# Patient Record
Sex: Female | Born: 1990 | Race: White | Hispanic: No | Smoking: Current some day smoker
Health system: Southern US, Community
[De-identification: ages and names within clinical notes are randomized; demographics above are authoritative.]

## PROBLEM LIST (undated history)

## (undated) ENCOUNTER — Inpatient Hospital Stay (HOSPITAL_COMMUNITY): Payer: Self-pay

## (undated) DIAGNOSIS — F429 Obsessive-compulsive disorder, unspecified: Secondary | ICD-10-CM

## (undated) DIAGNOSIS — G43909 Migraine, unspecified, not intractable, without status migrainosus: Secondary | ICD-10-CM

## (undated) DIAGNOSIS — F329 Major depressive disorder, single episode, unspecified: Secondary | ICD-10-CM

## (undated) DIAGNOSIS — Z8619 Personal history of other infectious and parasitic diseases: Secondary | ICD-10-CM

## (undated) DIAGNOSIS — F909 Attention-deficit hyperactivity disorder, unspecified type: Secondary | ICD-10-CM

## (undated) DIAGNOSIS — F32A Depression, unspecified: Secondary | ICD-10-CM

## (undated) DIAGNOSIS — F410 Panic disorder [episodic paroxysmal anxiety] without agoraphobia: Secondary | ICD-10-CM

## (undated) DIAGNOSIS — F988 Other specified behavioral and emotional disorders with onset usually occurring in childhood and adolescence: Secondary | ICD-10-CM

## (undated) DIAGNOSIS — F419 Anxiety disorder, unspecified: Secondary | ICD-10-CM

## (undated) DIAGNOSIS — R87629 Unspecified abnormal cytological findings in specimens from vagina: Secondary | ICD-10-CM

## (undated) HISTORY — DX: Depression, unspecified: F32.A

## (undated) HISTORY — DX: Anxiety disorder, unspecified: F41.9

## (undated) HISTORY — PX: COLONOSCOPY: SHX174

## (undated) HISTORY — DX: Personal history of other infectious and parasitic diseases: Z86.19

## (undated) HISTORY — DX: Major depressive disorder, single episode, unspecified: F32.9

## (undated) HISTORY — DX: Migraine, unspecified, not intractable, without status migrainosus: G43.909

## (undated) HISTORY — DX: Attention-deficit hyperactivity disorder, unspecified type: F90.9

## (undated) HISTORY — DX: Unspecified abnormal cytological findings in specimens from vagina: R87.629

---
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
## 2010-02-20 ENCOUNTER — Emergency Department (HOSPITAL_COMMUNITY): Admission: EM | Admit: 2010-02-20 | Discharge: 2010-02-20 | Payer: Self-pay | Admitting: Emergency Medicine

## 2010-03-14 ENCOUNTER — Telehealth: Payer: Self-pay | Admitting: Family Medicine

## 2010-03-17 ENCOUNTER — Ambulatory Visit: Payer: Self-pay | Admitting: Family Medicine

## 2010-03-17 DIAGNOSIS — G43909 Migraine, unspecified, not intractable, without status migrainosus: Secondary | ICD-10-CM | POA: Insufficient documentation

## 2010-03-17 DIAGNOSIS — J019 Acute sinusitis, unspecified: Secondary | ICD-10-CM

## 2010-03-22 ENCOUNTER — Encounter: Payer: Self-pay | Admitting: Family Medicine

## 2010-03-22 ENCOUNTER — Ambulatory Visit: Payer: Self-pay | Admitting: Internal Medicine

## 2010-03-22 DIAGNOSIS — S139XXA Sprain of joints and ligaments of unspecified parts of neck, initial encounter: Secondary | ICD-10-CM

## 2010-03-22 DIAGNOSIS — H6980 Other specified disorders of Eustachian tube, unspecified ear: Secondary | ICD-10-CM

## 2010-04-05 ENCOUNTER — Encounter: Admission: RE | Admit: 2010-04-05 | Discharge: 2010-04-05 | Payer: Self-pay | Admitting: Neurology

## 2010-06-06 ENCOUNTER — Ambulatory Visit: Payer: Self-pay | Admitting: Family Medicine

## 2010-06-09 ENCOUNTER — Ambulatory Visit: Payer: Self-pay | Admitting: Family Medicine

## 2010-06-09 DIAGNOSIS — R209 Unspecified disturbances of skin sensation: Secondary | ICD-10-CM

## 2010-06-13 LAB — CONVERTED CEMR LAB
ALT: 13 units/L (ref 0–35)
AST: 22 units/L (ref 0–37)
Albumin: 4.4 g/dL (ref 3.5–5.2)
Alkaline Phosphatase: 66 units/L (ref 39–117)
BUN: 10 mg/dL (ref 6–23)
Basophils Absolute: 0 10*3/uL (ref 0.0–0.1)
Basophils Relative: 0.3 % (ref 0.0–3.0)
Bilirubin, Direct: 0.1 mg/dL (ref 0.0–0.3)
CO2: 27 meq/L (ref 19–32)
Calcium: 9.4 mg/dL (ref 8.4–10.5)
Chloride: 106 meq/L (ref 96–112)
Creatinine, Ser: 0.6 mg/dL (ref 0.4–1.2)
Eosinophils Absolute: 0.1 10*3/uL (ref 0.0–0.7)
Eosinophils Relative: 2 % (ref 0.0–5.0)
GFR calc non Af Amer: 129.71 mL/min (ref >60)
Glucose, Bld: 80 mg/dL (ref 70–99)
HCT: 43.5 % (ref 36.0–46.0)
Hemoglobin: 15 g/dL (ref 12.0–15.0)
Lymphocytes Relative: 24.9 % (ref 12.0–46.0)
Lymphs Abs: 1.3 10*3/uL (ref 0.7–4.0)
MCHC: 34.4 g/dL (ref 30.0–36.0)
MCV: 90.9 fL (ref 78.0–100.0)
Monocytes Absolute: 0.3 10*3/uL (ref 0.1–1.0)
Monocytes Relative: 6.5 % (ref 3.0–12.0)
Neutro Abs: 3.5 10*3/uL (ref 1.4–7.7)
Neutrophils Relative %: 66.3 % (ref 43.0–77.0)
Platelets: 251 10*3/uL (ref 150.0–400.0)
Potassium: 4.2 meq/L (ref 3.5–5.1)
RBC: 4.79 M/uL (ref 3.87–5.11)
RDW: 13.3 % (ref 11.5–14.6)
Sodium: 140 meq/L (ref 135–145)
TSH: 1.62 microintl units/mL (ref 0.35–5.50)
Total Bilirubin: 0.8 mg/dL (ref 0.3–1.2)
Total Protein: 7.8 g/dL (ref 6.0–8.3)
Vitamin B-12: 228 pg/mL (ref 211–911)
WBC: 5.3 10*3/uL (ref 4.5–10.5)

## 2010-07-19 ENCOUNTER — Telehealth: Payer: Self-pay | Admitting: Family Medicine

## 2010-08-09 ENCOUNTER — Telehealth: Payer: Self-pay | Admitting: Family Medicine

## 2010-10-15 HISTORY — PX: BLADDER SURGERY: SHX569

## 2010-10-26 ENCOUNTER — Emergency Department (HOSPITAL_COMMUNITY)
Admission: EM | Admit: 2010-10-26 | Discharge: 2010-10-26 | Payer: Self-pay | Source: Home / Self Care | Admitting: Emergency Medicine

## 2010-10-30 ENCOUNTER — Ambulatory Visit
Admission: RE | Admit: 2010-10-30 | Discharge: 2010-10-30 | Payer: Self-pay | Source: Home / Self Care | Attending: Family Medicine | Admitting: Family Medicine

## 2010-10-30 DIAGNOSIS — N390 Urinary tract infection, site not specified: Secondary | ICD-10-CM | POA: Insufficient documentation

## 2010-10-30 DIAGNOSIS — N760 Acute vaginitis: Secondary | ICD-10-CM | POA: Insufficient documentation

## 2010-10-30 LAB — URINALYSIS, ROUTINE W REFLEX MICROSCOPIC
Bilirubin Urine: NEGATIVE
Ketones, ur: NEGATIVE mg/dL
Nitrite: NEGATIVE
Protein, ur: NEGATIVE mg/dL
Specific Gravity, Urine: 1.022 (ref 1.005–1.030)
Urine Glucose, Fasting: NEGATIVE mg/dL
Urobilinogen, UA: 1 mg/dL (ref 0.0–1.0)
pH: 6.5 (ref 5.0–8.0)

## 2010-10-30 LAB — WET PREP, GENITAL
Trich, Wet Prep: NONE SEEN
Yeast Wet Prep HPF POC: NONE SEEN

## 2010-10-30 LAB — GC/CHLAMYDIA PROBE AMP, GENITAL
Chlamydia, DNA Probe: NEGATIVE
GC Probe Amp, Genital: NEGATIVE

## 2010-10-30 LAB — URINE MICROSCOPIC-ADD ON

## 2010-10-30 LAB — POCT PREGNANCY, URINE: Preg Test, Ur: NEGATIVE

## 2010-11-03 ENCOUNTER — Ambulatory Visit
Admission: RE | Admit: 2010-11-03 | Discharge: 2010-11-03 | Payer: Self-pay | Source: Home / Self Care | Attending: Family Medicine | Admitting: Family Medicine

## 2010-11-03 ENCOUNTER — Telehealth: Payer: Self-pay | Admitting: Family Medicine

## 2010-11-14 NOTE — Progress Notes (Signed)
Summary: Rx Refill  Phone Note Refill Request Call back at Home Phone [phone]   Refills Requested: Medication #1:  ADDERALL 10 MG TABS two times a day Completey out of meds.   Method Requested: Pick up at Office Initial call taken by: Trixie Dredge,  July 19, 2010 11:57 AM Caller: Patient Call placed by: Trixie Dredge,  July 19, 2010 11:57 AM  Follow-up for Phone Call        done Follow-up by: Nelwyn Salisbury MD,  July 19, 2010 2:25 PM  Additional Follow-up for Phone Call Additional follow up Details #1::        Pt informed Rx ready for pick-up Additional Follow-up by: Sid Falcon LPN,  July 19, 2010 4:37 PM    New/Updated Medications: ADDERALL 10 MG TABS (AMPHETAMINE-DEXTROAMPHETAMINE) two times a day ADDERALL 10 MG TABS (AMPHETAMINE-DEXTROAMPHETAMINE) two times a day, may fill on 08-19-10 ADDERALL 10 MG TABS (AMPHETAMINE-DEXTROAMPHETAMINE) two times a day, may fill on 09-18-10 Prescriptions: ADDERALL 10 MG TABS (AMPHETAMINE-DEXTROAMPHETAMINE) two times a day, may fill on 09-18-10  #60 x 0   Entered and Authorized by:   Nelwyn Salisbury MD   Signed by:   Nelwyn Salisbury MD on 07/19/2010   Method used:   Print then Give to Patient   RxID:   [id number] ADDERALL 10 MG TABS (AMPHETAMINE-DEXTROAMPHETAMINE) two times a day, may fill on 08-19-10  #60 x 0   Entered and Authorized by:   Nelwyn Salisbury MD   Signed by:   Nelwyn Salisbury MD on 07/19/2010   Method used:   Print then Give to Patient   RxID:   [id number] ADDERALL 10 MG TABS (AMPHETAMINE-DEXTROAMPHETAMINE) two times a day  #60 x 0   Entered and Authorized by:   Nelwyn Salisbury MD   Signed by:   Nelwyn Salisbury MD on 07/19/2010   Method used:   Print then Give to Patient   RxID:   [id number]

## 2010-11-14 NOTE — Assessment & Plan Note (Signed)
Summary: ST / SINUSITIS? // RS   Vital Signs:  Patient profile:   20 year old female Weight:      132 pounds BMI:     20.75 Temp:     98.3 degrees F oral BP sitting:   84 / 60  (left arm) Cuff size:   regular  Vitals Entered By: Raechel Ache, RN (March 17, 2010 11:41 AM) CC: C/o sore throat, sinus cong, ears full, headache.   History of Present Illness: Here for 5 days of sinus pressure, HA, ST, and a dry cough. No fever. Also she asks me to write her a rx for Xanax. She had tried Ativan for anxiety, but it did not work well and it made her feel nauseated. She tried some of her friend's Xanax, and this worked quite well with no side effects. She was in the ER a few weeks ago for a migraine HA. She is using Excedrin Migraine now with success, but she is scheduled to meet with a neurologist for the first time in a few weeks. She does not remember his name.   Allergies (verified): No Known Drug Allergies  Past History:  Past Medical History: Anxiety Depression ADHD asthma migraines  Review of Systems  The patient denies anorexia, fever, weight loss, weight gain, vision loss, decreased hearing, hoarseness, chest pain, syncope, dyspnea on exertion, peripheral edema, hemoptysis, abdominal pain, melena, hematochezia, severe indigestion/heartburn, hematuria, incontinence, genital sores, muscle weakness, suspicious skin lesions, transient blindness, difficulty walking, depression, unusual weight change, abnormal bleeding, enlarged lymph nodes, angioedema, breast masses, and testicular masses.    Physical Exam  General:  Well-developed,well-nourished,in no acute distress; alert,appropriate and cooperative throughout examination Head:  Normocephalic and atraumatic without obvious abnormalities. No apparent alopecia or balding. Eyes:  No corneal or conjunctival inflammation noted. EOMI. Perrla. Funduscopic exam benign, without hemorrhages, exudates or papilledema. Vision grossly  normal. Ears:  External ear exam shows no significant lesions or deformities.  Otoscopic examination reveals clear canals, tympanic membranes are intact bilaterally without bulging, retraction, inflammation or discharge. Hearing is grossly normal bilaterally. Nose:  External nasal examination shows no deformity or inflammation. Nasal mucosa are pink and moist without lesions or exudates. Mouth:  Oral mucosa and oropharynx without lesions or exudates.  Teeth in good repair. Neck:  No deformities, masses, or tenderness noted. Lungs:  Normal respiratory effort, chest expands symmetrically. Lungs are clear to auscultation, no crackles or wheezes. Neurologic:  alert & oriented X3, cranial nerves II-XII intact, and gait normal.   Psych:  Cognition and judgment appear intact. Alert and cooperative with normal attention span and concentration. No apparent delusions, illusions, hallucinations   Impression & Recommendations:  Problem # 1:  ACUTE SINUSITIS, UNSPECIFIED (ICD-461.9)  Her updated medication list for this problem includes:    Zithromax Z-pak 250 Mg Tabs (Azithromycin) .Marland Kitchen... As directed  Problem # 2:  MIGRAINE HEADACHE (ICD-346.90)  Problem # 3:  ASTHMA (ICD-493.90)  Her updated medication list for this problem includes:    Proventil Hfa 108 (90 Base) Mcg/act Aers (Albuterol sulfate) .Marland Kitchen... 1-2 puffs every 4hrs as needed for shortness of breath.  Complete Medication List: 1)  Proventil Hfa 108 (90 Base) Mcg/act Aers (Albuterol sulfate) .Marland Kitchen.. 1-2 puffs every 4hrs as needed for shortness of breath. 2)  Adderall 10 Mg Tabs (Amphetamine-dextroamphetamine) .... Two times a day, may fill on 05-13-10 3)  Alprazolam 0.25 Mg Tabs (Alprazolam) .... Two times a day as needed anxiety 4)  Zithromax Z-pak 250 Mg Tabs (Azithromycin) .Marland KitchenMarland KitchenMarland Kitchen  As directed  Patient Instructions: 1)  Out of work today. Try Xanax as above.  2)  Please schedule a follow-up appointment as needed .  Prescriptions: ZITHROMAX  Z-PAK 250 MG TABS (AZITHROMYCIN) as directed  #1 x 0   Entered and Authorized by:   Nelwyn Salisbury MD   Signed by:   Nelwyn Salisbury MD on 03/17/2010   Method used:   Print then Give to Patient   RxID:   [id number] ALPRAZOLAM 0.25 MG TABS (ALPRAZOLAM) two times a day as needed anxiety  #60 x 2   Entered and Authorized by:   Nelwyn Salisbury MD   Signed by:   Nelwyn Salisbury MD on 03/17/2010   Method used:   Print then Give to Patient   RxID:   [id number]

## 2010-11-14 NOTE — Progress Notes (Signed)
Summary: Pt req Adderall 10mg  tabs  Phone Note Call from Patient Call back at [phone]   Caller: Patient Summary of Call: Pt req Adderall 10mg  tabs.  Initial call taken by: Lucy Antigua,  December 12, 2009 11:50 AM  Follow-up for Phone Call        done Follow-up by: Nelwyn Salisbury MD,  December 12, 2009 4:45 PM  Additional Follow-up for Phone Call Additional follow up Details #1::        rx up front ready for p/u, pt aware Additional Follow-up by: Alfred Levins, CMA,  December 12, 2009 4:54 PM    New/Updated Medications: ADDERALL 10 MG TABS (AMPHETAMINE-DEXTROAMPHETAMINE) two times a day ADDERALL 10 MG TABS (AMPHETAMINE-DEXTROAMPHETAMINE) two times a day, may fill on 01-09-10 ADDERALL 10 MG TABS (AMPHETAMINE-DEXTROAMPHETAMINE) two times a day, may fill on 02-09-10 Prescriptions: ADDERALL 10 MG TABS (AMPHETAMINE-DEXTROAMPHETAMINE) two times a day, may fill on 02-09-10  #60 x 0   Entered and Authorized by:   Nelwyn Salisbury MD   Signed by:   Nelwyn Salisbury MD on 12/12/2009   Method used:   Print then Give to Patient   RxID:   [id number] ADDERALL 10 MG TABS (AMPHETAMINE-DEXTROAMPHETAMINE) two times a day, may fill on 01-09-10  #60 x 0   Entered and Authorized by:   Nelwyn Salisbury MD   Signed by:   Nelwyn Salisbury MD on 12/12/2009   Method used:   Print then Give to Patient   RxID:   [id number] ADDERALL 10 MG TABS (AMPHETAMINE-DEXTROAMPHETAMINE) two times a day  #60 x 0   Entered and Authorized by:   Nelwyn Salisbury MD   Signed by:   Nelwyn Salisbury MD on 12/12/2009   Method used:   Print then Give to Patient   RxID:   [id number]

## 2010-11-14 NOTE — Progress Notes (Signed)
Summary: refill  Phone Note Refill Request Call back at [phone] Message from:  Patient---live call  Refills Requested: Medication #1:  ADDERALL 10 MG TABS two times a day call pt when ready.  Initial call taken by: Warnell Forester,  Mar 14, 2010 2:35 PM  Follow-up for Phone Call        done Follow-up by: Nelwyn Salisbury MD,  Mar 14, 2010 4:29 PM  Additional Follow-up for Phone Call Additional follow up Details #1::        called Additional Follow-up by: Raechel Ache, RN,  Mar 14, 2010 4:32 PM    New/Updated Medications: ADDERALL 10 MG TABS (AMPHETAMINE-DEXTROAMPHETAMINE) two times a day ADDERALL 10 MG TABS (AMPHETAMINE-DEXTROAMPHETAMINE) two times a day, may fill on 04-13-10 ADDERALL 10 MG TABS (AMPHETAMINE-DEXTROAMPHETAMINE) two times a day, may fill on 05-13-10 Prescriptions: ADDERALL 10 MG TABS (AMPHETAMINE-DEXTROAMPHETAMINE) two times a day, may fill on 05-13-10  #60 x 0   Entered and Authorized by:   Nelwyn Salisbury MD   Signed by:   Nelwyn Salisbury MD on 03/14/2010   Method used:   Print then Give to Patient   RxID:   [id number] ADDERALL 10 MG TABS (AMPHETAMINE-DEXTROAMPHETAMINE) two times a day, may fill on 04-13-10  #60 x 0   Entered and Authorized by:   Nelwyn Salisbury MD   Signed by:   Nelwyn Salisbury MD on 03/14/2010   Method used:   Print then Give to Patient   RxID:   [id number] ADDERALL 10 MG TABS (AMPHETAMINE-DEXTROAMPHETAMINE) two times a day  #60 x 0   Entered and Authorized by:   Nelwyn Salisbury MD   Signed by:   Nelwyn Salisbury MD on 03/14/2010   Method used:   Print then Give to Patient   RxID:   [id number]
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
## 2010-11-14 NOTE — Letter (Signed)
Summary: [facility]  [facility]   Imported By: Maryln Gottron 04/03/2010 12:14:51  _____________________________________________________________________  External Attachment:    Type:   Image     Comment:   External Document

## 2010-11-14 NOTE — Assessment & Plan Note (Signed)
Summary: med check/refill/cjr/pt rsc/cjr ok per cindy/njr   Vital Signs:  Patient profile:   21 year old female Weight:      138 pounds Temp:     98.4 degrees F oral Pulse rate:   100 / minute BP sitting:   112 / 64  (left arm) Cuff size:   regular  Vitals Entered By: Alfred Levins, CMA (October 19, 2009 4:45 PM) CC: discuss meds   History of Present Illness: Here to follow up on meds for ADHD, anxiety, and depression. She is very pleased with Adderall and wants to stay on this. The amitriptyline however is causing a lot of side effects including dayitme somnolence and vivid upsetting dreams at night. Her anxiety is no better either.   Current Medications (verified): 1)  Proventil Hfa 108 (90 Base) Mcg/act Aers (Albuterol Sulfate) .Marland Kitchen.. 1-2 Puffs Every 4hrs As Needed For Shortness of Breath. 2)  Adderall 10 Mg Tabs (Amphetamine-Dextroamphetamine) .... Two Times A Day, May Fill On 11-07-09 3)  Amitriptyline Hcl 25 Mg Tabs (Amitriptyline Hcl) .... At Bedtime  Allergies (verified): No Known Drug Allergies  Past History:  Past Medical History: Reviewed history from 08/10/2009 and no changes required. Anxiety Depression ADHD asthma  Review of Systems  The patient denies anorexia, fever, weight loss, weight gain, vision loss, decreased hearing, hoarseness, chest pain, syncope, dyspnea on exertion, peripheral edema, prolonged cough, headaches, hemoptysis, abdominal pain, melena, hematochezia, severe indigestion/heartburn, hematuria, incontinence, genital sores, muscle weakness, suspicious skin lesions, transient blindness, difficulty walking, unusual weight change, abnormal bleeding, enlarged lymph nodes, angioedema, breast masses, and testicular masses.    Physical Exam  General:  Well-developed,well-nourished,in no acute distress; alert,appropriate and cooperative throughout examination Neurologic:  alert & oriented X3, cranial nerves II-XII intact, and gait normal.   Psych:   Cognition and judgment appear intact. Alert and cooperative with normal attention span and concentration. No apparent delusions, illusions, hallucinations   Impression & Recommendations:  Problem # 1:  DEPRESSION (ICD-311)  The following medications were removed from the medication list:    Amitriptyline Hcl 25 Mg Tabs (Amitriptyline hcl) .Marland Kitchen... At bedtime Her updated medication list for this problem includes:    Ativan 1 Mg Tabs (Lorazepam) .Marland Kitchen... 1 or 2 tabs two times a day as needed anxiety  Problem # 2:  ANXIETY (ICD-300.00)  The following medications were removed from the medication list:    Amitriptyline Hcl 25 Mg Tabs (Amitriptyline hcl) .Marland Kitchen... At bedtime Her updated medication list for this problem includes:    Ativan 1 Mg Tabs (Lorazepam) .Marland Kitchen... 1 or 2 tabs two times a day as needed anxiety  Problem # 3:  ADD (ICD-314.00)  Complete Medication List: 1)  Proventil Hfa 108 (90 Base) Mcg/act Aers (Albuterol sulfate) .Marland Kitchen.. 1-2 puffs every 4hrs as needed for shortness of breath. 2)  Adderall 10 Mg Tabs (Amphetamine-dextroamphetamine) .... Two times a day, may fill on 11-07-09 3)  Ativan 1 Mg Tabs (Lorazepam) .Marland Kitchen.. 1 or 2 tabs two times a day as needed anxiety  Patient Instructions: 1)  refilled Adderall. Stop Amitriptyline. try Ativan on an as needed basis.  2)  Please schedule a follow-up appointment in 1 month.  Prescriptions: ATIVAN 1 MG TABS (LORAZEPAM) 1 or 2 tabs two times a day as needed anxiety  #60 x 2   Entered and Authorized by:   Nelwyn Salisbury MD   Signed by:   Nelwyn Salisbury MD on 10/19/2009   Method used:   Print then Give to  Patient   RxID:   [id number]

## 2010-11-14 NOTE — Assessment & Plan Note (Signed)
Summary: face/ eye twitching//ccm   Vital Signs:  Patient profile:   20 year old female Weight:      131 pounds BMI:     20.59 Temp:     98.6 degrees F oral BP sitting:   90 / 60  (left arm) Cuff size:   regular  Vitals Entered By: Raechel Ache, RN (June 09, 2010 9:38 AM) CC: C/o body aches, fatigue, fearful & anxious, had face tingling yesterday.   History of Present Illness: Here for 2 things. First she has had increasing problems with anxiety for the past 6 months. She is always tense, worries about things, and she says she has a lot of obsessive-compulsive tendencies. She sleeps well, and she has not been particularly depressed lately. Shehas used SSRIs in the past like Zoloft and Prozac with mixed results. She would like to try Lexapro now. Second, yesterday am at work she had the sudden onset of tingling all over her face and in both hands.No vision changes, no HA, no other symptoms. This lasted all day unti.l she went to bed. This am the tingling is gone and she feels okay. Of note she just started on Topamax 5 days ago.   Allergies: No Known Drug Allergies  Past History:  Past Medical History: Reviewed history from 06/06/2010 and no changes required. Anxiety Depression ADHD asthma migraines, sees Dr. Jake Samples  Review of Systems  The patient denies anorexia, fever, weight loss, weight gain, vision loss, decreased hearing, hoarseness, chest pain, syncope, dyspnea on exertion, peripheral edema, prolonged cough, headaches, hemoptysis, abdominal pain, melena, hematochezia, severe indigestion/heartburn, hematuria, incontinence, genital sores, muscle weakness, suspicious skin lesions, transient blindness, difficulty walking, depression, unusual weight change, abnormal bleeding, enlarged lymph nodes, angioedema, breast masses, and testicular masses.    Physical Exam  General:  Well-developed,well-nourished,in no acute distress; alert,appropriate and cooperative  throughout examination Head:  Normocephalic and atraumatic without obvious abnormalities. No apparent alopecia or balding. Eyes:  No corneal or conjunctival inflammation noted. EOMI. Perrla. Funduscopic exam benign, without hemorrhages, exudates or papilledema. Vision grossly normal. Ears:  External ear exam shows no significant lesions or deformities.  Otoscopic examination reveals clear canals, tympanic membranes are intact bilaterally without bulging, retraction, inflammation or discharge. Hearing is grossly normal bilaterally. Nose:  External nasal examination shows no deformity or inflammation. Nasal mucosa are pink and moist without lesions or exudates. Mouth:  Oral mucosa and oropharynx without lesions or exudates.  Teeth in good repair. Neck:  No deformities, masses, or tenderness noted. Lungs:  Normal respiratory effort, chest expands symmetrically. Lungs are clear to auscultation, no crackles or wheezes. Heart:  Normal rate and regular rhythm. S1 and S2 normal without gallop, murmur, click, rub or other extra sounds. Neurologic:  No cranial nerve deficits noted. Station and gait are normal. Plantar reflexes are down-going bilaterally. DTRs are symmetrical throughout. Sensory, motor and coordinative functions appear intact. Psych:  Oriented X3, memory intact for recent and remote, normally interactive, good eye contact, and moderately anxious.     Impression & Recommendations:  Problem # 1:  PARESTHESIA (ICD-782.0)  Orders: Venipuncture (16109) TLB-BMP (Basic Metabolic Panel-BMET) (80048-METABOL) TLB-CBC Platelet - w/Differential (85025-CBCD) TLB-Hepatic/Liver Function Pnl (80076-HEPATIC) TLB-TSH (Thyroid Stimulating Hormone) (84443-TSH) TLB-B12, Serum-Total ONLY (60454-U98)  Problem # 2:  MIGRAINE HEADACHE (ICD-346.90)  Her updated medication list for this problem includes:    Ibuprofen 800 Mg Tabs (Ibuprofen) .Marland Kitchen... 1 by mouth q8 hours  for pain    Sumatriptan Succinate 100 Mg  Tabs (Sumatriptan succinate) .Marland KitchenMarland KitchenMarland KitchenMarland Kitchen  As needed  Problem # 3:  ANXIETY (ICD-300.00)  Her updated medication list for this problem includes:    Alprazolam 0.25 Mg Tabs (Alprazolam) .Marland Kitchen..Marland Kitchen Two times a day as needed anxiety    Lexapro 10 Mg Tabs (Escitalopram oxalate) ..... Once daily  Complete Medication List: 1)  Proventil Hfa 108 (90 Base) Mcg/act Aers (Albuterol sulfate) .Marland Kitchen.. 1-2 puffs every 4hrs as needed for shortness of breath. 2)  Adderall 10 Mg Tabs (Amphetamine-dextroamphetamine) .... Two times a day, may fill on 05-13-10 3)  Alprazolam 0.25 Mg Tabs (Alprazolam) .... Two times a day as needed anxiety 4)  Ibuprofen 800 Mg Tabs (Ibuprofen) .Marland Kitchen.. 1 by mouth q8 hours  for pain 5)  Topamax 25 Mg Tabs (Topiramate) .... Once daily 6)  Sumatriptan Succinate 100 Mg Tabs (Sumatriptan succinate) .... As needed 7)  Mirena 20 Mcg/24hr Iud (Levonorgestrel) 8)  Lexapro 10 Mg Tabs (Escitalopram oxalate) .... Once daily  Patient Instructions: 1)  I think the tingling was a side efect of Topamax, so I told her to stop taaking this. Will get labs today. try Lexapro daily. Given today off work.  Prescriptions: LEXAPRO 10 MG TABS (ESCITALOPRAM OXALATE) once daily  #30 x 2   Entered and Authorized by:   Nelwyn Salisbury MD   Signed by:   Nelwyn Salisbury MD on 06/09/2010   Method used:   Electronically to        [facility]  [phone]* (retail)       [address]       [place], [place]  [address]       Ph: [phone] or [phone]       Fax: [phone]   RxID:   [id number]
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
## 2010-11-14 NOTE — Progress Notes (Signed)
Summary: requesting alternate rx  Phone Note Call from Patient Call back at Home Phone [phone]   Caller: Patient---live call Summary of Call: On Adderall 10mg  on manuf. back order. please advise of alternate. pt is out of meds. Initial call taken by: Warnell Forester,  August 09, 2010 1:27 PM  Follow-up for Phone Call        Pt called back and said she took original script to [facility] and was told that they only had 30, but since then, the pt has found out that [facility] pharmacy has the Adderall 10mg  two times a day. Pt req to get script asap today, because pharmacy will run out.  Follow-up by: Lucy Antigua,  August 09, 2010 2:43 PM  Additional Follow-up for Phone Call Additional follow up Details #1::        change to Adderall XR Additional Follow-up by: Nelwyn Salisbury MD,  August 09, 2010 4:32 PM    Additional Follow-up for Phone Call Additional follow up Details #2::    pt aware.  Follow-up by: Pura Spice, RN,  August 09, 2010 4:58 PM  New/Updated Medications: ADDERALL XR 10 MG XR24H-CAP (AMPHETAMINE-DEXTROAMPHETAMINE) once daily Prescriptions: ADDERALL XR 10 MG XR24H-CAP (AMPHETAMINE-DEXTROAMPHETAMINE) once daily  #30 x 0   Entered and Authorized by:   Nelwyn Salisbury MD   Signed by:   Nelwyn Salisbury MD on 08/09/2010   Method used:   Print then Give to Patient   RxID:   [id number]

## 2010-11-16 NOTE — Assessment & Plan Note (Signed)
Summary: lump on neck/ccm   Vital Signs:  Patient profile:   20 year old female Weight:      138 pounds Temp:     98.5 degrees F oral BP sitting:   96 / 62  (left arm) Cuff size:   regular  Vitals Entered By: Alfred Levins, CMA (October 30, 2010 2:15 PM) CC: painful lump on neck   History of Present Illness: Here for a complex picture. She started to have pelvic pain last week, and saw her GYN. She was diagnosed with a UTI and started on Macrobid. Then the pelvic pain got worse so she went to the ER over the weekend. They diagnosed a bacterial vaginosis and started her on Flagyl. This is better now, but then 2 days ago she developed sinus pressure, left ear pain, ST, and a painful swelling on the left side of her neck. No fever or cough.   Current Medications (verified): 1)  Adderall 10 Mg Tabs (Amphetamine-Dextroamphetamine) .... Two Times A Day, May Fill On 09-18-10 2)  Alprazolam 0.25 Mg Tabs (Alprazolam) .... Two Times A Day As Needed Anxiety 3)  Ibuprofen 800 Mg Tabs (Ibuprofen) .Marland Kitchen.. 1 By Mouth Q8 Hours  For Pain 4)  Imitrex 100 Mg Tabs (Sumatriptan Succinate) .Marland Kitchen.. 1 By Mouth At The Onset of Migraine. May Repeat Dose in One Hour If Headache Not Resolved 5)  Sumatriptan Succinate 100 Mg Tabs (Sumatriptan Succinate) .... As Needed 6)  Mirena 20 Mcg/24hr Iud (Levonorgestrel) 7)  Adderall Xr 10 Mg Xr24h-Cap (Amphetamine-Dextroamphetamine) .... Once Daily  Allergies (verified): No Known Drug Allergies  Past History:  Past Medical History: Reviewed history from 06/06/2010 and no changes required. Anxiety Depression ADHD asthma migraines, sees Dr. Jake Samples  Past Surgical History: Reviewed history from 07/09/2007 and no changes required. Denies surgical history  Review of Systems  The patient denies anorexia, fever, weight loss, weight gain, vision loss, decreased hearing, hoarseness, chest pain, syncope, dyspnea on exertion, peripheral edema, prolonged cough,  hemoptysis, melena, hematochezia, severe indigestion/heartburn, hematuria, incontinence, genital sores, muscle weakness, suspicious skin lesions, transient blindness, difficulty walking, depression, unusual weight change, abnormal bleeding, enlarged lymph nodes, angioedema, breast masses, and testicular masses.    Physical Exam  General:  Well-developed,well-nourished,in no acute distress; alert,appropriate and cooperative throughout examination Head:  Normocephalic and atraumatic without obvious abnormalities. No apparent alopecia or balding. Eyes:  No corneal or conjunctival inflammation noted. EOMI. Perrla. Funduscopic exam benign, without hemorrhages, exudates or papilledema. Vision grossly normal. Ears:  External ear exam shows no significant lesions or deformities.  Otoscopic examination reveals clear canals, tympanic membranes are intact bilaterally without bulging, retraction, inflammation or discharge. Hearing is grossly normal bilaterally. Nose:  External nasal examination shows no deformity or inflammation. Nasal mucosa are pink and moist without lesions or exudates. Mouth:  Oral mucosa and oropharynx without lesions or exudates.  Teeth in good repair. Neck:  No deformities, masses, or tenderness noted. Lungs:  Normal respiratory effort, chest expands symmetrically. Lungs are clear to auscultation, no crackles or wheezes. Abdomen:  Bowel sounds positive,abdomen soft and non-tender without masses, organomegaly or hernias noted.   Impression & Recommendations:  Problem # 1:  ACUTE SINUSITIS, UNSPECIFIED (ICD-461.9)  Her updated medication list for this problem includes:    Sulfamethoxazole-trimethoprim 400-80 Mg/67ml Soln (Sulfamethoxazole-trimethoprim) .Marland Kitchen... 2 tsp two times a day  Problem # 2:  UTI (ICD-599.0)  Her updated medication list for this problem includes:    Sulfamethoxazole-trimethoprim 400-80 Mg/44ml Soln (Sulfamethoxazole-trimethoprim) .Marland Kitchen... 2 tsp two times a  day  Problem # 3:  BACTERIAL VAGINITIS (ICD-616.10)  Her updated medication list for this problem includes:    Sulfamethoxazole-trimethoprim 400-80 Mg/14ml Soln (Sulfamethoxazole-trimethoprim) .Marland Kitchen... 2 tsp two times a day  Complete Medication List: 1)  Adderall 10 Mg Tabs (Amphetamine-dextroamphetamine) .... Two times a day, may fill on 09-18-10 2)  Alprazolam 0.25 Mg Tabs (Alprazolam) .... Two times a day as needed anxiety 3)  Ibuprofen 800 Mg Tabs (Ibuprofen) .Marland Kitchen.. 1 by mouth q8 hours  for pain 4)  Imitrex 100 Mg Tabs (Sumatriptan succinate) .Marland Kitchen.. 1 by mouth at the onset of migraine. may repeat dose in one hour if headache not resolved 5)  Sumatriptan Succinate 100 Mg Tabs (Sumatriptan succinate) .... As needed 6)  Mirena 20 Mcg/24hr Iud (Levonorgestrel) 7)  Adderall Xr 10 Mg Xr24h-cap (Amphetamine-dextroamphetamine) .... Once daily 8)  Sulfamethoxazole-trimethoprim 400-80 Mg/85ml Soln (Sulfamethoxazole-trimethoprim) .... 2 tsp two times a day  Patient Instructions: 1)  Please schedule a follow-up appointment as needed .  Prescriptions: ADDERALL XR 10 MG XR24H-CAP (AMPHETAMINE-DEXTROAMPHETAMINE) once daily  #30 x 0   Entered and Authorized by:   Nelwyn Salisbury MD   Signed by:   Nelwyn Salisbury MD on 10/30/2010   Method used:   Print then Give to Patient   RxID:   [id number] SULFAMETHOXAZOLE-TRIMETHOPRIM 400-80 MG/5ML SOLN (SULFAMETHOXAZOLE-TRIMETHOPRIM) 2 tsp two times a day  #10 x 0   Entered and Authorized by:   Nelwyn Salisbury MD   Signed by:   Nelwyn Salisbury MD on 10/30/2010   Method used:   Print then Give to Patient   RxID:   [id number]    Orders Added: 1)  Est. Patient Level IV [44034]

## 2010-11-16 NOTE — Progress Notes (Signed)
Summary: REQUEST FOR DIFFERENT ABX?  Phone Note Call from Patient   Caller: Patient  [phone] Summary of Call: Pt wants to have new Rx sent to [facility].... Adv that the med SULFAMETHOXAZOLE-TRIMETHOPRIM 400-80 MG/5ML (abx) that she was given is causing diarrhea.Marland KitchenMarland KitchenMarland KitchenDeclined OV, can't miss work - adv she just wants Rx changed.... Pt can be reached at [phone].  Initial call taken by: Debbra Riding,  November 03, 2010 8:21 AM  Follow-up for Phone Call        mom call concerning daughter getting ov, pt decline ov for today Follow-up by: Heron Sabins,  November 03, 2010 8:40 AM  Additional Follow-up for Phone Call Additional follow up Details #1::        Pt called back and adv that she was at work but she has decided to see if she can get off to come in for OV.... Pt scheduled for OV at 2:30pm today with Dr Clent Ridges.  Additional Follow-up by: Debbra Riding,  November 03, 2010 8:45 AM

## 2010-11-16 NOTE — Assessment & Plan Note (Signed)
Summary: DIARRHEA - SXS OF ABX? // RS   Vital Signs:  Patient profile:   20 year old female O2 Sat:      97 % Temp:     98.2 degrees F Pulse rate:   106 / minute BP sitting:   90 / 60  (left arm) Cuff size:   regular  Vitals Entered By: Pura Spice, RN (November 03, 2010 2:42 PM) CC: diarrhea since sunday took laxative on Sat states was on 2 antibiotics  completed flagyl now on sulfa   History of Present Illness: Here for several days of diarrhea after being on multiple antibiotics in the past few weeks. She has finished the Flagy, and now her pelvic pain has totally resolved. She has a few more days of BACTRIM left, and now her urinary symptoms are gone as well. She has had several days of diarrhea, and this is why she came today. No N or V, no fever.   Allergies: No Known Drug Allergies  Past History:  Past Medical History: Reviewed history from 06/06/2010 and no changes required. Anxiety Depression ADHD asthma migraines, sees Dr. Jake Samples  Review of Systems  The patient denies anorexia, fever, weight loss, weight gain, vision loss, decreased hearing, hoarseness, chest pain, syncope, dyspnea on exertion, peripheral edema, prolonged cough, headaches, hemoptysis, abdominal pain, melena, hematochezia, severe indigestion/heartburn, hematuria, incontinence, genital sores, muscle weakness, suspicious skin lesions, transient blindness, difficulty walking, depression, unusual weight change, abnormal bleeding, enlarged lymph nodes, angioedema, breast masses, and testicular masses.    Physical Exam  General:  Well-developed,well-nourished,in no acute distress; alert,appropriate and cooperative throughout examination Abdomen:  Bowel sounds positive,abdomen soft and non-tender without masses, organomegaly or hernias noted.   Impression & Recommendations:  Problem # 1:  UTI (ICD-599.0)  Her updated medication list for this problem includes:    Sulfamethoxazole-trimethoprim  400-80 Mg/44ml Soln (Sulfamethoxazole-trimethoprim) .Marland Kitchen... 2 tsp two times a day  Problem # 2:  BACTERIAL VAGINITIS (ICD-616.10)  Her updated medication list for this problem includes:    Sulfamethoxazole-trimethoprim 400-80 Mg/21ml Soln (Sulfamethoxazole-trimethoprim) .Marland Kitchen... 2 tsp two times a day  Complete Medication List: 1)  Adderall 10 Mg Tabs (Amphetamine-dextroamphetamine) .... Two times a day, may fill on 09-18-10 2)  Alprazolam 0.25 Mg Tabs (Alprazolam) .... Two times a day as needed anxiety 3)  Ibuprofen 800 Mg Tabs (Ibuprofen) .Marland Kitchen.. 1 by mouth q8 hours  for pain 4)  Imitrex 100 Mg Tabs (Sumatriptan succinate) .Marland Kitchen.. 1 by mouth at the onset of migraine. may repeat dose in one hour if headache not resolved 5)  Sumatriptan Succinate 100 Mg Tabs (Sumatriptan succinate) .... As needed 6)  Mirena 20 Mcg/24hr Iud (Levonorgestrel) 7)  Sulfamethoxazole-trimethoprim 400-80 Mg/35ml Soln (Sulfamethoxazole-trimethoprim) .... 2 tsp two times a day 8)  Adderall 10 Mg Tabs (Amphetamine-dextroamphetamine) .... Two times a day 9)  Lomotil 2.5-0.025 Mg Tabs (Diphenoxylate-atropine) .Marland Kitchen.. 1 tab q 6 hours as needed diarrhea  Patient Instructions: 1)  Finsh up the Bactrim. Drink fluids. try Lomotil as needed .  Prescriptions: LOMOTIL 2.5-0.025 MG TABS (DIPHENOXYLATE-ATROPINE) 1 tab q 6 hours as needed diarrhea  #60 x 0   Entered and Authorized by:   Nelwyn Salisbury MD   Signed by:   Nelwyn Salisbury MD on 11/03/2010   Method used:   Print then Give to Patient   RxID:   [id number]    Orders Added: 1)  Est. Patient Level IV [32951]

## 2010-12-15 ENCOUNTER — Telehealth: Payer: Self-pay | Admitting: Family Medicine

## 2010-12-15 MED ORDER — AMPHETAMINE-DEXTROAMPHETAMINE 10 MG PO TABS: 10.0000 mg | ORAL_TABLET | Freq: Two times a day (BID) | ORAL | Status: DC

## 2010-12-15 NOTE — Telephone Encounter (Signed)
adderall rx's are in your box. Call in Xanax 0.25 mg bid , #60 with 5 rf

## 2010-12-15 NOTE — Telephone Encounter (Signed)
PT NEEDS A REFILL MEDS: ADDERALL 10MG .... PT CAN BE REACHED AT [phone].Marland KitchenMarland KitchenMarland KitchenAlso needs refill on xanax .25 sent to [facility].

## 2010-12-15 NOTE — Telephone Encounter (Signed)
rx up front ready for p/u, pt aware 

## 2011-01-02 LAB — URINE MICROSCOPIC-ADD ON

## 2011-01-02 LAB — URINALYSIS, ROUTINE W REFLEX MICROSCOPIC
Bilirubin Urine: NEGATIVE
Glucose, UA: NEGATIVE mg/dL
Hgb urine dipstick: NEGATIVE
Ketones, ur: NEGATIVE mg/dL
Nitrite: NEGATIVE
Protein, ur: NEGATIVE mg/dL
Specific Gravity, Urine: 1.018 (ref 1.005–1.030)
Urobilinogen, UA: 0.2 mg/dL (ref 0.0–1.0)
pH: 7.5 (ref 5.0–8.0)

## 2011-01-02 LAB — PREGNANCY, URINE: Preg Test, Ur: NEGATIVE

## 2011-02-19 ENCOUNTER — Other Ambulatory Visit: Payer: Self-pay | Admitting: Family Medicine

## 2011-02-20 ENCOUNTER — Other Ambulatory Visit: Payer: Self-pay | Admitting: *Deleted

## 2011-02-20 NOTE — Telephone Encounter (Signed)
Pt is switching pharmacies and would like a refill on xanax 0.25mg 

## 2011-02-21 MED ORDER — ALPRAZOLAM 0.25 MG PO TABS: 0.2500 mg | ORAL_TABLET | Freq: Two times a day (BID) | ORAL | Status: DC

## 2011-02-21 NOTE — Telephone Encounter (Signed)
She takes this bid. Call in #60 with 5 rf

## 2011-02-21 NOTE — Telephone Encounter (Signed)
Per Pt, [facility] is new pharmacy. Rx phoned in. Done.

## 2011-03-02 NOTE — Assessment & Plan Note (Signed)
[facility]                                   ON-CALL NOTE   Brooke Castaneda, Brooke Castaneda                       MRN:          [id number]  DATE:06/21/2006                            DOB:          1991/10/12    CALLER:  Brett Canales at [facility].   TELEPHONE:  [phone].   She sees Dr. Abran Cantor.   I saw the patient today in my clinic for head lice.  I prescribed 5%  Permethrin shampoo.  Pharmacy reports that they only have 5% Permethrin  cream on hand.  My response is to change the prescription to the cream and  to use the directions given before.                                   Tera Mater. Clent Ridges, MD   SAF/MedQ  DD:  06/21/2006  DT:  06/22/2006  Job #:  [id number]

## 2011-04-17 ENCOUNTER — Ambulatory Visit (HOSPITAL_BASED_OUTPATIENT_CLINIC_OR_DEPARTMENT_OTHER)
Admission: RE | Admit: 2011-04-17 | Discharge: 2011-04-17 | Disposition: A | Discharge status: Home / Self Care | Payer: 59 | Source: Ambulatory Visit | Attending: Urology | Admitting: Urology

## 2011-04-17 DIAGNOSIS — Z01812 Encounter for preprocedural laboratory examination: Secondary | ICD-10-CM | POA: Insufficient documentation

## 2011-04-17 DIAGNOSIS — N949 Unspecified condition associated with female genital organs and menstrual cycle: Secondary | ICD-10-CM | POA: Insufficient documentation

## 2011-04-17 DIAGNOSIS — R350 Frequency of micturition: Secondary | ICD-10-CM | POA: Insufficient documentation

## 2011-04-17 DIAGNOSIS — N3010 Interstitial cystitis (chronic) without hematuria: Secondary | ICD-10-CM | POA: Insufficient documentation

## 2011-04-17 LAB — POCT HEMOGLOBIN-HEMACUE: Hemoglobin: 14.9 g/dL (ref 12.0–15.0)

## 2011-04-17 LAB — POCT PREGNANCY, URINE: Preg Test, Ur: NEGATIVE

## 2011-05-01 NOTE — Op Note (Signed)
  Brooke Castaneda, TIMMINS NO.:  [id number]  MEDICAL RECORD NO.:  [id number]  LOCATION:                                 FACILITY:  PHYSICIAN:  Martina Sinner, MD DATE OF BIRTH:  May 27, 1991  DATE OF PROCEDURE:  04/17/2011 DATE OF DISCHARGE:                              OPERATIVE REPORT   PREOPERATIVE DIAGNOSES:  Pelvic pain and frequency.  POSTOPERATIVE DIAGNOSES:  Interstitial cystitis.  SURGERY:  Cystoscopy, bladder hydrodistention, bladder instillation therapy.  Ms. Verline Lema has frequency that is ordinary for age and some pelvic pressure.  She was prepped and draped in usual fashion.  A 21-French scope was utilized.  Preoperative antibiotics were given.  On initial inspection, bladder, mucosa, and trigone were normal.  No stitch, foreign body, or carcinoma.  Trigone was normal.  She was hydrodistended at 80 cm of water to 450 mL.  Bladder was emptied.  I re-inspected her bladder and she had diffuse glomerulations throughout her bladder.  There was no bladder injury.  Bladder was emptied.  As a separate procedure, the red rubber catheter, I injected 15 cc of 0.5% Marcaine plus 400 mg of Pyridium.  Ms. Verline Lema will be treated for interstitial cystitis.          ______________________________ Martina Sinner, MD     SAM/MEDQ  D:  04/17/2011  T:  04/17/2011  Job:  [id number]  Electronically Signed by Alfredo Martinez MD on 05/01/2011 08:14:04 AM

## 2011-07-04 ENCOUNTER — Encounter: Payer: Self-pay | Admitting: Family Medicine

## 2011-07-04 ENCOUNTER — Ambulatory Visit (INDEPENDENT_AMBULATORY_CARE_PROVIDER_SITE_OTHER): Payer: 59 | Admitting: Family Medicine

## 2011-07-04 VITALS — BP 100/62 | HR 93 | Temp 98.5°F | Wt 141.0 lb

## 2011-07-04 DIAGNOSIS — B9789 Other viral agents as the cause of diseases classified elsewhere: Secondary | ICD-10-CM

## 2011-07-04 DIAGNOSIS — B349 Viral infection, unspecified: Secondary | ICD-10-CM

## 2011-07-04 MED ORDER — PROMETHAZINE HCL 25 MG PO TABS
25.0000 mg | ORAL_TABLET | ORAL | Status: AC | PRN
Start: 2011-07-04 — End: 2011-07-11

## 2011-07-04 NOTE — Progress Notes (Signed)
  Subjective:    Patient ID: Brooke Castaneda, female    DOB: 1991/06/02, 20 y.o.   MRN: [id number]  HPI Here for 2 days of fevers, body aches, HA, ST, a dry cough, and nausea without vomiting. Today all this is better. She is drinking fluids. Taking Tylenol prn .   Review of Systems  Constitutional: Positive for fever.  HENT: Negative for congestion, neck pain, neck stiffness, postnasal drip and sinus pressure.   Eyes: Negative.   Respiratory: Positive for cough.   Gastrointestinal: Positive for nausea. Negative for vomiting and abdominal pain.       Objective:   Physical Exam  Constitutional: She appears well-developed and well-nourished.  HENT:  Right Ear: External ear normal.  Left Ear: External ear normal.  Nose: Nose normal.  Mouth/Throat: Oropharynx is clear and moist. No oropharyngeal exudate.  Eyes: Conjunctivae are normal. Pupils are equal, round, and reactive to light.  Neck: Neck supple. No thyromegaly present.  Pulmonary/Chest: Effort normal and breath sounds normal.  Lymphadenopathy:    She has no cervical adenopathy.          Assessment & Plan:  Rest, fluids. Recheck prn

## 2011-07-12 LAB — COMPREHENSIVE METABOLIC PANEL
ALT: 11
AST: 17
Albumin: 3.9
Alkaline Phosphatase: 67
BUN: 4 — ABNORMAL LOW
CO2: 28
Calcium: 9.3
Chloride: 104
Creatinine, Ser: 0.64
Glucose, Bld: 95
Potassium: 3.6
Sodium: 138
Total Bilirubin: 0.9
Total Protein: 7.4

## 2011-07-12 LAB — DIFFERENTIAL
Basophils Relative: 0
Eosinophils Absolute: 0.1
Eosinophils Relative: 1
Monocytes Absolute: 0.6
Monocytes Relative: 6
Neutrophils Relative %: 79 — ABNORMAL HIGH

## 2011-07-12 LAB — GC/CHLAMYDIA PROBE AMP, GENITAL
Chlamydia, DNA Probe: NEGATIVE
GC Probe Amp, Genital: NEGATIVE

## 2011-07-12 LAB — CBC
HCT: 39.6
Hemoglobin: 13.8
MCHC: 34.9
MCV: 86.7
Platelets: 286
RBC: 4.57
RDW: 12.9
WBC: 10.9

## 2011-07-12 LAB — URINALYSIS, ROUTINE W REFLEX MICROSCOPIC
Bilirubin Urine: NEGATIVE
Glucose, UA: NEGATIVE
Ketones, ur: NEGATIVE
Nitrite: NEGATIVE
Protein, ur: 100 — AB
Specific Gravity, Urine: 1.005
Urobilinogen, UA: 0.2
pH: 7

## 2011-07-12 LAB — WET PREP, GENITAL: Yeast Wet Prep HPF POC: NONE SEEN

## 2011-07-12 LAB — URINE MICROSCOPIC-ADD ON

## 2011-07-12 LAB — AMYLASE: Amylase: 59

## 2011-07-12 LAB — PREGNANCY, URINE: Preg Test, Ur: NEGATIVE

## 2011-09-10 ENCOUNTER — Encounter: Payer: Self-pay | Admitting: Internal Medicine

## 2011-09-10 ENCOUNTER — Ambulatory Visit (INDEPENDENT_AMBULATORY_CARE_PROVIDER_SITE_OTHER): Payer: 59 | Admitting: Internal Medicine

## 2011-09-10 VITALS — BP 100/60 | HR 60 | Temp 98.5°F | Wt 139.0 lb

## 2011-09-10 DIAGNOSIS — H6690 Otitis media, unspecified, unspecified ear: Secondary | ICD-10-CM

## 2011-09-10 DIAGNOSIS — F17200 Nicotine dependence, unspecified, uncomplicated: Secondary | ICD-10-CM | POA: Insufficient documentation

## 2011-09-10 DIAGNOSIS — F411 Generalized anxiety disorder: Secondary | ICD-10-CM

## 2011-09-10 DIAGNOSIS — H6693 Otitis media, unspecified, bilateral: Secondary | ICD-10-CM | POA: Insufficient documentation

## 2011-09-10 DIAGNOSIS — J069 Acute upper respiratory infection, unspecified: Secondary | ICD-10-CM | POA: Insufficient documentation

## 2011-09-10 MED ORDER — AMOXICILLIN 400 MG/5ML PO SUSR: ORAL | Status: DC

## 2011-09-10 NOTE — Progress Notes (Signed)
  Subjective:    Patient ID: Brooke Castaneda, female    DOB: 1990-12-16, 20 y.o.   MRN: [id number]  HPI Patient comes in today for SDA  For acute problem evaluation. With family today  Onset 4 days  With uri sx but  worse sx  With ears aching left and right "like fly in ear. " Used q tips  And ? No help  Pain last pm hard to sleep from pain  Onset with fever   Non e now thought was getting better and now worse today . No hx of ear pain.  Tobacco  Pre illness  1/3 ppd  For a while. Cough dry minor  no sob.  Nose  Stuffy   Lot of yellow nasal drainage  Some face discomfort mild  No hx of om except when young   Review of Systems NO cp sob  Wheeze some ha no current fever feels bad and some st    Past history family history social history reviewed in the electronic medical record.  ON stimulant and qid valium for anxiety 5 mg    . Works [employer]  Didn't work today  Off tomorrow    Objective:   Physical Exam WDWN in NAD  quiet respirations; mildly congested  somewhat hoarse. Non toxic . HEENT: Normocephalic ;atraumatic , Eyes;  PERRL, EOMs  Full, lids and conjunctiva clear,,Ears: no deformities, canals nl, TM landmarks decrease full and yellow fluid red and splayed light reflex  Left more than right , Nose: no deformity or discharge but congested;face minimally tender Mouth : OP clear without lesion or edema . Neck: Supple without adenopathy or masses or bruits tender left ac node  Chest:  Clear to A&P without wheezes rales or rhonchi CV:  S1-S2 no gallops or murmurs peripheral perfusion is normal Skin :nl perfusion and no acute rashes  Pierced nostril     Assessment & Plan:  complicated uri with OM serous vs purulent    Expectant management.  Uri Tobacco  Disc importance of cessation   High risk meds  Valium  Per psych   Dr Evelene Croon

## 2011-09-10 NOTE — Patient Instructions (Signed)
Antibiotic may make ear infection  Better quicker. The original illness however is probably viral respiratory and can take 10- 14 days to resolve. . But should feel better otherwise in a few day.   Stop tobacco for your health.

## 2011-09-14 ENCOUNTER — Telehealth: Payer: Self-pay | Admitting: Family Medicine

## 2011-09-14 MED ORDER — AMOXICILLIN-POT CLAVULANATE 400-57 MG PO CHEW: 2.0000 | CHEWABLE_TABLET | Freq: Two times a day (BID) | ORAL | Status: DC

## 2011-09-14 NOTE — Telephone Encounter (Signed)
Pt aware of results 

## 2011-09-14 NOTE — Telephone Encounter (Signed)
Tell pateint I sent in  Chewable augmentin adult dose  fo sinus and ear infection

## 2011-09-14 NOTE — Telephone Encounter (Signed)
Pt was in on Monday and saw Dr. Fabian Sharp and was given an abx. But has been taking it and isn't feeling any better. Pt requesting something else be called in for her. Please contact

## 2011-09-14 NOTE — Telephone Encounter (Signed)
Spoke to pt- can't hear out her ears, still has sinus pressure, ear pain, no fever, still coughing and congestion. Has been on the antibiotic for 7 days.   Per Dr. Fabian Sharp- Augmentin

## 2011-09-24 ENCOUNTER — Encounter (HOSPITAL_COMMUNITY): Payer: Self-pay | Admitting: *Deleted

## 2011-09-24 ENCOUNTER — Emergency Department (HOSPITAL_COMMUNITY)
Admission: EM | Admit: 2011-09-24 | Discharge: 2011-09-24 | Disposition: A | Discharge status: Home / Self Care | Payer: 59 | Attending: Emergency Medicine | Admitting: Emergency Medicine

## 2011-09-24 DIAGNOSIS — B9689 Other specified bacterial agents as the cause of diseases classified elsewhere: Secondary | ICD-10-CM | POA: Insufficient documentation

## 2011-09-24 DIAGNOSIS — R109 Unspecified abdominal pain: Secondary | ICD-10-CM | POA: Insufficient documentation

## 2011-09-24 DIAGNOSIS — N760 Acute vaginitis: Secondary | ICD-10-CM | POA: Insufficient documentation

## 2011-09-24 DIAGNOSIS — A499 Bacterial infection, unspecified: Secondary | ICD-10-CM | POA: Insufficient documentation

## 2011-09-24 DIAGNOSIS — F17200 Nicotine dependence, unspecified, uncomplicated: Secondary | ICD-10-CM | POA: Insufficient documentation

## 2011-09-24 DIAGNOSIS — N6459 Other signs and symptoms in breast: Secondary | ICD-10-CM | POA: Insufficient documentation

## 2011-09-24 LAB — WET PREP, GENITAL

## 2011-09-24 LAB — URINALYSIS, ROUTINE W REFLEX MICROSCOPIC
Leukocytes, UA: NEGATIVE
Nitrite: NEGATIVE
Protein, ur: NEGATIVE mg/dL
Specific Gravity, Urine: 1.037 — ABNORMAL HIGH (ref 1.005–1.030)
Urobilinogen, UA: 0.2 mg/dL (ref 0.0–1.0)

## 2011-09-24 LAB — URINE MICROSCOPIC-ADD ON

## 2011-09-24 MED ORDER — SULFAMETHOXAZOLE-TRIMETHOPRIM 800-160 MG PO TABS: 1.0000 | ORAL_TABLET | Freq: Two times a day (BID) | ORAL | Status: DC

## 2011-09-24 MED ORDER — ONDANSETRON HCL 4 MG PO TABS: 4.0000 mg | ORAL_TABLET | Freq: Four times a day (QID) | ORAL | Status: AC

## 2011-09-24 MED ORDER — KETOROLAC TROMETHAMINE 60 MG/2ML IM SOLN
60.0000 mg | Freq: Once | INTRAMUSCULAR | Status: AC
  Administered 2011-09-24: 60 mg via INTRAMUSCULAR
  Filled 2011-09-24: qty 2

## 2011-09-24 MED ORDER — METRONIDAZOLE 500 MG PO TABS: 500.0000 mg | ORAL_TABLET | Freq: Two times a day (BID) | ORAL | Status: AC

## 2011-09-24 NOTE — ED Provider Notes (Signed)
  Physical Exam  BP 112/74  Pulse 74  Temp(Src) 98.6 F (37 C) (Oral)  Resp 18  Wt 140 lb (63.504 kg)  SpO2 100%  Physical Exam Awaiting pregnancy test and results of wet prep  ED Course  Procedures Will discharge per Tiffany Green's evaluation with a prescription for Flagyl to treat BV.  Patient is not pregnant MDM Vaginal bleeding       Arman Filter, NP 09/24/11 2220  Arman Filter, NP 09/24/11 2220

## 2011-09-24 NOTE — ED Provider Notes (Signed)
Medical screening examination/treatment/procedure(s) were performed by non-physician practitioner and as supervising physician I was immediately available for consultation/collaboration.  Flint Melter, MD 09/24/11 [phone]

## 2011-09-24 NOTE — ED Provider Notes (Signed)
History     CSN: [id number] Arrival date & time: 09/24/2011  4:15 PM   First MD Initiated Contact with Patient 09/24/11 1828      Chief Complaint  Patient presents with  . Contraception    pt has IUD.  Marland Kitchen Vaginal Bleeding    pt c/o vaginal discharge, yellow in color since after thanksgiving. pt also reports vaginal bleeding x's 2 days. pt denies odor.   . Abdominal Pain    pt c/o lower abd pain/cramping.     (Consider location/radiation/quality/duration/timing/severity/associated sxs/prior treatment) Patient is a 20 y.o. female presenting with vaginal bleeding, abdominal pain, and vaginal discharge.  Vaginal Bleeding Associated symptoms include abdominal pain. Pertinent negatives include no anorexia, arthralgias, change in bowel habit, chest pain, chills, congestion, fever, headaches, nausea, rash, sore throat or vomiting.  Abdominal Pain The primary symptoms of the illness include abdominal pain, vaginal discharge and vaginal bleeding. The primary symptoms of the illness do not include fever, nausea or vomiting.  Symptoms associated with the illness do not include chills or anorexia.  Vaginal Discharge This is a new problem. The current episode started in the past 7 days. The problem occurs daily. The problem has been unchanged. Associated symptoms include abdominal pain. Pertinent negatives include no anorexia, arthralgias, change in bowel habit, chest pain, chills, congestion, fever, headaches, nausea, rash, sore throat or vomiting. The symptoms are aggravated by nothing. She has tried nothing for the symptoms.    Pt presents to the ED with complaints of vaginal discharge and vaginal bleeding. Pt having some mild cramping sensations in lower abd. Pt has an IUD in place and has for 3 years without complications.  Past Medical History  Diagnosis Date  . Anxiety   . Depression   . ADHD (attention deficit hyperactivity disorder)   . Asthma   . Migraines     sees Dr. Asa Lente     History reviewed. No pertinent past surgical history.  History reviewed. No pertinent family history.  History  Substance Use Topics  . Smoking status: Current Everyday Smoker -- 0.5 packs/day for 4 years    Types: Cigarettes  . Smokeless tobacco: Never Used  . Alcohol Use: Yes    OB History    Grav Para Term Preterm Abortions TAB SAB Ect Mult Living                  Review of Systems  Constitutional: Negative for fever and chills.  HENT: Negative for congestion and sore throat.   Cardiovascular: Negative for chest pain.  Gastrointestinal: Positive for abdominal pain. Negative for nausea, vomiting, anorexia and change in bowel habit.  Genitourinary: Positive for vaginal bleeding and vaginal discharge.  Musculoskeletal: Negative for arthralgias.  Skin: Negative for rash.  Neurological: Negative for headaches.  All other systems reviewed and are negative.    Allergies  Review of patient's allergies indicates no known allergies.  Home Medications   Current Outpatient Rx  Name Route Sig Dispense Refill  . DIAZEPAM 5 MG PO TABS Oral Take 5 mg by mouth every 6 (six) hours as needed. For anxiety.    . SUMATRIPTAN SUCCINATE 100 MG PO TABS Oral Take 100 mg by mouth. Take 1 by mouth at the onset of migraine.  May repeat dose in 1 hour if headache not resolved.       BP 112/74  Pulse 74  Temp(Src) 98.6 F (37 C) (Oral)  Resp 18  Wt 140 lb (63.504 kg)  SpO2 100%  Physical  Exam  Nursing note and vitals reviewed. Constitutional: She appears well-developed and well-nourished.  HENT:  Head: Normocephalic and atraumatic.  Eyes: Conjunctivae are normal. Pupils are equal, round, and reactive to light.  Neck: Trachea normal, normal range of motion and full passive range of motion without pain. Neck supple.  Cardiovascular: Normal rate, regular rhythm and normal pulses.   Pulmonary/Chest: Effort normal and breath sounds normal. Chest wall is not dull to percussion. She  exhibits no tenderness, no crepitus, no edema, no deformity and no retraction.  Abdominal: Soft. Normal appearance and bowel sounds are normal.  Genitourinary: Vagina normal and uterus normal. There is breast discharge. No breast swelling, tenderness or bleeding. Pelvic exam was performed with patient supine. There is no rash on the right labia. There is no rash on the left labia. Cervix exhibits no discharge (blood and discharged mixed in vaginal canal) and no friability. Right adnexum displays no mass, no tenderness and no fullness. Left adnexum displays no mass, no tenderness and no fullness.  Musculoskeletal: Normal range of motion.  Neurological: She is alert. She has normal strength.  Skin: Skin is warm, dry and intact.  Psychiatric: She has a normal mood and affect. Her speech is normal and behavior is normal. Judgment and thought content normal. Cognition and memory are normal.    ED Course  Procedures (including critical care time)  Labs Reviewed  URINALYSIS, ROUTINE W REFLEX MICROSCOPIC - Abnormal; Notable for the following:    APPearance CLOUDY (*)    Specific Gravity, Urine 1.037 (*)    Hgb urine dipstick MODERATE (*)    Bilirubin Urine SMALL (*)    Ketones, ur TRACE (*)    All other components within normal limits  URINE MICROSCOPIC-ADD ON  POCT PREGNANCY, URINE  WET PREP, GENITAL  GC/CHLAMYDIA PROBE AMP, GENITAL  PREGNANCY, URINE   No results found.   No diagnosis found.    MDM  Will treat infection and have pt follow-up with her gynecologist.        Dorthula Matas, PA 09/24/11 2002

## 2011-09-24 NOTE — ED Notes (Signed)
Started having clear to yellow to blood discharge over the past 2 weeks. States that she has an IUD and has not had a cycle in 3 years and wanted to make sure her IUD has not moved.

## 2011-09-25 LAB — GC/CHLAMYDIA PROBE AMP, GENITAL
Chlamydia, DNA Probe: NEGATIVE
GC Probe Amp, Genital: NEGATIVE

## 2011-09-25 NOTE — ED Provider Notes (Signed)
Medical screening examination/treatment/procedure(s) were performed by non-physician practitioner and as supervising physician I was immediately available for consultation/collaboration.  Nicholes Stairs, MD 09/25/11 [phone]

## 2011-10-15 ENCOUNTER — Telehealth: Payer: Self-pay | Admitting: Family Medicine

## 2011-10-15 NOTE — Telephone Encounter (Addendum)
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 Pt has signed MR form

## 2011-10-17 ENCOUNTER — Ambulatory Visit (INDEPENDENT_AMBULATORY_CARE_PROVIDER_SITE_OTHER): Payer: 59 | Admitting: Family Medicine

## 2011-10-17 DIAGNOSIS — Z0000 Encounter for general adult medical examination without abnormal findings: Secondary | ICD-10-CM

## 2011-10-19 LAB — TB SKIN TEST
Induration: 0
TB Skin Test: NEGATIVE mm

## 2011-10-22 NOTE — Telephone Encounter (Signed)
ROI completed, immunization hx printed, and everything was faxed per pt's request.

## 2011-11-08 ENCOUNTER — Telehealth: Payer: Self-pay | Admitting: Family Medicine

## 2011-11-08 NOTE — Telephone Encounter (Signed)
Pt said that she called re: tb test results. Pt says that these results need to be faxed to her employer. Pls send to fax# [phone] attn Richard or Selena Batten at [facility]

## 2011-11-08 NOTE — Telephone Encounter (Signed)
I faxed tb results.

## 2012-07-29 ENCOUNTER — Ambulatory Visit (INDEPENDENT_AMBULATORY_CARE_PROVIDER_SITE_OTHER): Payer: 59 | Admitting: Family Medicine

## 2012-07-29 ENCOUNTER — Encounter: Payer: Self-pay | Admitting: Family Medicine

## 2012-07-29 VITALS — BP 100/60 | HR 84 | Temp 98.6°F | Wt 134.0 lb

## 2012-07-29 DIAGNOSIS — J029 Acute pharyngitis, unspecified: Secondary | ICD-10-CM

## 2012-07-29 MED ORDER — CEPHALEXIN 250 MG/5ML PO SUSR: 500.0000 mg | Freq: Three times a day (TID) | ORAL | Status: DC

## 2012-07-29 NOTE — Progress Notes (Signed)
  Subjective:    Patient ID: Brooke Castaneda, female    DOB: 07-Apr-1991, 21 y.o.   MRN: [id number]  HPI Here for 3 days of a bad ST and swollen nodes in the neck. No fever. She thinks this is a strep throat because she got this often when she was younger.    Review of Systems  Constitutional: Negative.   HENT: Positive for sore throat. Negative for congestion, postnasal drip and sinus pressure.   Eyes: Negative.   Respiratory: Negative.        Objective:   Physical Exam  Constitutional: She appears well-developed and well-nourished.  HENT:  Right Ear: External ear normal.  Left Ear: External ear normal.  Nose: Nose normal.       Posterior OP is red without exudate   Eyes: Conjunctivae normal are normal.  Neck: Neck supple. No thyromegaly present.       Tender AC nodes bilaterally   Pulmonary/Chest: Effort normal and breath sounds normal.          Assessment & Plan:  Probable strep. Given Keflex and add Advil prn

## 2012-10-07 ENCOUNTER — Telehealth: Payer: Self-pay | Admitting: Family Medicine

## 2012-10-07 DIAGNOSIS — Z209 Contact with and (suspected) exposure to unspecified communicable disease: Secondary | ICD-10-CM

## 2012-10-07 NOTE — Telephone Encounter (Signed)
Patient called stating that she would like to be tested for Hep C when she comes in for her cpx labs on 11/05/12. Please assist.

## 2012-10-09 NOTE — Telephone Encounter (Signed)
I put this in as a future order

## 2012-10-09 NOTE — Telephone Encounter (Signed)
Spoke to pt told her order was put in for Hep C screen when she comes in for her CPX labs. Pt verbalized understanding.

## 2012-10-16 ENCOUNTER — Ambulatory Visit: Payer: 59 | Admitting: Family Medicine

## 2012-10-17 ENCOUNTER — Ambulatory Visit (INDEPENDENT_AMBULATORY_CARE_PROVIDER_SITE_OTHER): Payer: 59 | Admitting: Family Medicine

## 2012-10-17 DIAGNOSIS — Z209 Contact with and (suspected) exposure to unspecified communicable disease: Secondary | ICD-10-CM

## 2012-10-20 ENCOUNTER — Ambulatory Visit (INDEPENDENT_AMBULATORY_CARE_PROVIDER_SITE_OTHER): Payer: 59 | Admitting: Family Medicine

## 2012-10-20 ENCOUNTER — Encounter: Payer: Self-pay | Admitting: Family Medicine

## 2012-10-20 VITALS — BP 112/70 | HR 98 | Temp 98.5°F | Wt 139.0 lb

## 2012-10-20 DIAGNOSIS — R1110 Vomiting, unspecified: Secondary | ICD-10-CM

## 2012-10-20 DIAGNOSIS — J111 Influenza due to unidentified influenza virus with other respiratory manifestations: Secondary | ICD-10-CM

## 2012-10-20 DIAGNOSIS — J45909 Unspecified asthma, uncomplicated: Secondary | ICD-10-CM

## 2012-10-20 MED ORDER — ALBUTEROL SULFATE HFA 108 (90 BASE) MCG/ACT IN AERS: 2.0000 | INHALATION_SPRAY | RESPIRATORY_TRACT | Status: DC | PRN

## 2012-10-20 MED ORDER — GUAIFENESIN-CODEINE 100-10 MG/5ML PO SYRP: 5.0000 mL | ORAL_SOLUTION | Freq: Three times a day (TID) | ORAL | Status: DC | PRN

## 2012-10-20 NOTE — Patient Instructions (Addendum)

## 2012-10-20 NOTE — Progress Notes (Signed)
Chief Complaint  Patient presents with  . chest congestion    cough, vomiting has resolved, headache, body ahces, chills, had fever but resolved     HPI: -started:3  days -symptoms:nasal congestion, sore throat, cough, vomiting, fevers - rigors, body aches, subjective fever -denies:SOB, NVD, tooth pain, no flu, strep or mono exposure -has tried: nyquil -sick contacts: none known -did not have flu shot -Hx of: hx of asthma remotely - but know flares or tx in many years   ROS: See pertinent positives and negatives per HPI.  Past Medical History  Diagnosis Date  . Anxiety   . Depression   . ADHD (attention deficit hyperactivity disorder)   . Asthma   . Migraines     sees Dr. Asa Lente    No family history on file.  History   Social History  . Marital Status: Single    Spouse Name: N/A    Number of Children: N/A  . Years of Education: N/A   Social History Main Topics  . Smoking status: Current Every Day Smoker -- 0.5 packs/day for 4 years    Types: Cigarettes  . Smokeless tobacco: Never Used  . Alcohol Use: 0.5 oz/week    1 drink(s) per week  . Drug Use: No  . Sexually Active: None   Other Topics Concern  . None   Social History Narrative  . None    Current outpatient prescriptions:diazepam (VALIUM) 5 MG tablet, Take 5 mg by mouth every 6 (six) hours as needed. For anxiety., Disp: , Rfl: ;  SUMAtriptan (IMITREX) 100 MG tablet, Take 100 mg by mouth. Take 1 by mouth at the onset of migraine.  May repeat dose in 1 hour if headache not resolved. , Disp: , Rfl:  albuterol (PROVENTIL HFA;VENTOLIN HFA) 108 (90 BASE) MCG/ACT inhaler, Inhale 2 puffs into the lungs every 4 (four) hours as needed for wheezing., Disp: 1 Inhaler, Rfl: 0;  cephALEXin (KEFLEX) 250 MG/5ML suspension, Take 10 mLs (500 mg total) by mouth 3 (three) times daily., Disp: 300 mL, Rfl: 0;  guaiFENesin-codeine (ROBITUSSIN AC) 100-10 MG/5ML syrup, Take 5 mLs by mouth 3 (three) times daily as needed for  cough., Disp: 120 mL, Rfl: 0  EXAM:  Filed Vitals:   10/20/12 1416  BP: 112/70  Pulse: 98  Temp: 98.5 F (36.9 C)    There is no height on file to calculate BMI.  GENERAL: vitals reviewed and listed above, alert, oriented, appears well hydrated and in no acute distress  HEENT: atraumatic, conjunttiva clear, no obvious abnormalities on inspection of external nose and ears, normal appearance of ear canals and TMs, clear nasal congestion, mild post oropharyngeal erythema with PND, no tonsillar edema or exudate, no sinus TTP  NECK: no obvious masses on inspection  LUNGS: clear to auscultation bilaterally, no wheezes, rales or rhonchi, good air movement  CV: HRRR, no peripheral edema  MS: moves all extremities without noticeable abnormality  PSYCH: pleasant and cooperative, no obvious depression or anxiety  ASSESSMENT AND PLAN:  Discussed the following assessment and plan:  1. Influenza-like illness  albuterol (PROVENTIL HFA;VENTOLIN HFA) 108 (90 BASE) MCG/ACT inhaler, guaiFENesin-codeine (ROBITUSSIN AC) 100-10 MG/5ML syrup  2. Asthma    3. Vomiting     -lungs clear on exam, vomiting and fever resolved - supportive care advised, refilled albuterol to try for cough, no wheezing on exam today, return precautions - discussed risks of medications -flu like illness, hx of mild remote asthma with no current resp complaints -  tamiflu benefits ikely do not outway risks at this point give >48 hours into illness and mild illness -Patient advised to return or notify a doctor immediately if symptoms worsen or persist or new concerns arise.  Patient Instructions  INSTRUCTIONS FOR UPPER RESPIRATORY INFECTION:  -plenty of rest and fluids  -nasal saline wash 2-3 times daily (use prepackaged nasal saline or bottled/distilled water if making your own)   -can use sinex nasal spray for drainage and nasal congestion - but do NOT use longer then 3-4 days  -can use tylenol or ibuprofen as  directed for aches and sorethroat  -in the winter time, using a humidifier at night is helpful (please follow cleaning instructions)  -if you are taking a cough medication - use only as directed, may also try a teaspoon of honey to coat the throat and throat lozenges  -for sore throat, salt water gargles can help  -follow up if you have fevers, facial pain, tooth pain, difficulty breathing or are worsening or not getting better in 5-7 days      KIM, HANNAH R.

## 2012-11-05 ENCOUNTER — Other Ambulatory Visit: Payer: 59

## 2012-11-10 ENCOUNTER — Other Ambulatory Visit (INDEPENDENT_AMBULATORY_CARE_PROVIDER_SITE_OTHER): Payer: 59

## 2012-11-10 DIAGNOSIS — Z209 Contact with and (suspected) exposure to unspecified communicable disease: Secondary | ICD-10-CM

## 2012-11-10 DIAGNOSIS — Z0000 Encounter for general adult medical examination without abnormal findings: Secondary | ICD-10-CM

## 2012-11-10 LAB — CBC WITH DIFFERENTIAL/PLATELET
Basophils Absolute: 0 10*3/uL (ref 0.0–0.1)
Basophils Relative: 0.3 % (ref 0.0–3.0)
Eosinophils Absolute: 0.1 10*3/uL (ref 0.0–0.7)
Hemoglobin: 14.2 g/dL (ref 12.0–15.0)
Lymphocytes Relative: 31.4 % (ref 12.0–46.0)
MCHC: 34.4 g/dL (ref 30.0–36.0)
MCV: 90.7 fl (ref 78.0–100.0)
Monocytes Absolute: 0.3 10*3/uL (ref 0.1–1.0)
Neutro Abs: 2.9 10*3/uL (ref 1.4–7.7)
Neutrophils Relative %: 60.2 % (ref 43.0–77.0)
RBC: 4.55 Mil/uL (ref 3.87–5.11)
RDW: 13.1 % (ref 11.5–14.6)

## 2012-11-10 LAB — LIPID PANEL
Cholesterol: 172 mg/dL (ref 0–200)
LDL Cholesterol: 92 mg/dL (ref 0–99)
Triglycerides: 106 mg/dL (ref 0.0–149.0)

## 2012-11-10 LAB — TSH: TSH: 1.69 u[IU]/mL (ref 0.35–5.50)

## 2012-11-10 LAB — BASIC METABOLIC PANEL
CO2: 27 mEq/L (ref 19–32)
Calcium: 8.8 mg/dL (ref 8.4–10.5)
Chloride: 104 mEq/L (ref 96–112)
Creatinine, Ser: 0.6 mg/dL (ref 0.4–1.2)
Glucose, Bld: 89 mg/dL (ref 70–99)

## 2012-11-10 LAB — POCT URINALYSIS DIPSTICK
Glucose, UA: NEGATIVE
Nitrite, UA: NEGATIVE
Protein, UA: NEGATIVE
Urobilinogen, UA: 0.2

## 2012-11-10 LAB — HEPATIC FUNCTION PANEL
Albumin: 3.8 g/dL (ref 3.5–5.2)
Total Protein: 7.7 g/dL (ref 6.0–8.3)

## 2012-11-11 LAB — HEPATITIS C ANTIBODY: HCV Ab: NEGATIVE

## 2012-11-12 ENCOUNTER — Encounter: Payer: Self-pay | Admitting: Family Medicine

## 2012-11-12 ENCOUNTER — Ambulatory Visit (INDEPENDENT_AMBULATORY_CARE_PROVIDER_SITE_OTHER): Payer: 59 | Admitting: Family Medicine

## 2012-11-12 VITALS — BP 104/70 | HR 91 | Temp 98.2°F | Ht 67.75 in | Wt 146.0 lb

## 2012-11-12 DIAGNOSIS — Z0000 Encounter for general adult medical examination without abnormal findings: Secondary | ICD-10-CM

## 2012-11-12 NOTE — Progress Notes (Signed)
  Subjective:    Patient ID: Brooke Castaneda, female    DOB: Jun 06, 1991, 22 y.o.   MRN: [id number]  HPI 22 yr old female for a cpx. She feels well but has no question. Her asthma has been well controlled but she seems to have developed an allergy to her dog. Whenever she pets her dog she gets a mild red itchy reaction on her forearms which fades away over the next hour or so. This does not affect her breathing.    Review of Systems  Constitutional: Negative.   HENT: Negative.   Eyes: Negative.   Respiratory: Negative.   Cardiovascular: Negative.   Gastrointestinal: Negative.   Genitourinary: Negative for dysuria, urgency, frequency, hematuria, flank pain, decreased urine volume, enuresis, difficulty urinating, pelvic pain and dyspareunia.  Musculoskeletal: Negative.   Skin: Negative.   Neurological: Negative.   Hematological: Negative.   Psychiatric/Behavioral: Negative.        Objective:   Physical Exam  Constitutional: She is oriented to person, place, and time. She appears well-developed and well-nourished. No distress.  HENT:  Head: Normocephalic and atraumatic.  Right Ear: External ear normal.  Left Ear: External ear normal.  Nose: Nose normal.  Mouth/Throat: Oropharynx is clear and moist. No oropharyngeal exudate.  Eyes: Conjunctivae normal and EOM are normal. Pupils are equal, round, and reactive to light. No scleral icterus.  Neck: Normal range of motion. Neck supple. No JVD present. No thyromegaly present.  Cardiovascular: Normal rate, regular rhythm, normal heart sounds and intact distal pulses.  Exam reveals no gallop and no friction rub.   No murmur heard. Pulmonary/Chest: Effort normal and breath sounds normal. No respiratory distress. She has no wheezes. She has no rales. She exhibits no tenderness.  Abdominal: Soft. Bowel sounds are normal. She exhibits no distension and no mass. There is no tenderness. There is no rebound and no guarding.  Musculoskeletal:  Normal range of motion. She exhibits no edema and no tenderness.  Lymphadenopathy:    She has no cervical adenopathy.  Neurological: She is alert and oriented to person, place, and time. She has normal reflexes. No cranial nerve deficit. She exhibits normal muscle tone. Coordination normal.  Skin: Skin is warm and dry. No rash noted. No erythema.  Psychiatric: She has a normal mood and affect. Her behavior is normal. Judgment and thought content normal.          Assessment & Plan:  Well exam. Try Caritin or Zyrtec daily

## 2012-11-18 ENCOUNTER — Ambulatory Visit (INDEPENDENT_AMBULATORY_CARE_PROVIDER_SITE_OTHER): Payer: 59 | Admitting: Family Medicine

## 2012-11-18 ENCOUNTER — Encounter: Payer: Self-pay | Admitting: Family Medicine

## 2012-11-18 VITALS — BP 102/70 | HR 93 | Temp 98.3°F | Wt 147.0 lb

## 2012-11-18 DIAGNOSIS — J069 Acute upper respiratory infection, unspecified: Secondary | ICD-10-CM

## 2012-11-18 NOTE — Progress Notes (Signed)
  Subjective:    Patient ID: Brooke Castaneda, female    DOB: 04-05-1991, 22 y.o.   MRN: [id number]  HPI Here for the onset last night of stuffy head, PND, runny nose, and a ST. No fever or cough.    Review of Systems  Constitutional: Negative.   HENT: Positive for congestion, rhinorrhea, sneezing, postnasal drip and sinus pressure. Negative for ear pain.   Eyes: Negative.   Respiratory: Negative.        Objective:   Physical Exam  Constitutional: She appears well-developed and well-nourished. No distress.  HENT:  Right Ear: External ear normal.  Left Ear: External ear normal.  Nose: Nose normal.  Mouth/Throat: Oropharynx is clear and moist. No oropharyngeal exudate.  Eyes: Conjunctivae normal are normal.  Pulmonary/Chest: Effort normal and breath sounds normal.  Lymphadenopathy:    She has no cervical adenopathy.          Assessment & Plan:  Rest, fluids, Claritin D prn. Out of work today.

## 2012-11-23 ENCOUNTER — Emergency Department (HOSPITAL_COMMUNITY): Payer: 59

## 2012-11-23 ENCOUNTER — Emergency Department (HOSPITAL_COMMUNITY)
Admission: EM | Admit: 2012-11-23 | Discharge: 2012-11-23 | Disposition: A | Discharge status: Home / Self Care | Payer: 59 | Attending: Emergency Medicine | Admitting: Emergency Medicine

## 2012-11-23 ENCOUNTER — Encounter (HOSPITAL_COMMUNITY): Payer: Self-pay

## 2012-11-23 DIAGNOSIS — T148XXA Other injury of unspecified body region, initial encounter: Secondary | ICD-10-CM | POA: Insufficient documentation

## 2012-11-23 DIAGNOSIS — Z8659 Personal history of other mental and behavioral disorders: Secondary | ICD-10-CM | POA: Insufficient documentation

## 2012-11-23 DIAGNOSIS — F17200 Nicotine dependence, unspecified, uncomplicated: Secondary | ICD-10-CM | POA: Insufficient documentation

## 2012-11-23 DIAGNOSIS — M546 Pain in thoracic spine: Secondary | ICD-10-CM | POA: Insufficient documentation

## 2012-11-23 DIAGNOSIS — X58XXXA Exposure to other specified factors, initial encounter: Secondary | ICD-10-CM | POA: Insufficient documentation

## 2012-11-23 DIAGNOSIS — M25519 Pain in unspecified shoulder: Secondary | ICD-10-CM | POA: Insufficient documentation

## 2012-11-23 DIAGNOSIS — Z8679 Personal history of other diseases of the circulatory system: Secondary | ICD-10-CM | POA: Insufficient documentation

## 2012-11-23 DIAGNOSIS — Y929 Unspecified place or not applicable: Secondary | ICD-10-CM | POA: Insufficient documentation

## 2012-11-23 DIAGNOSIS — F411 Generalized anxiety disorder: Secondary | ICD-10-CM | POA: Insufficient documentation

## 2012-11-23 DIAGNOSIS — Z79899 Other long term (current) drug therapy: Secondary | ICD-10-CM | POA: Insufficient documentation

## 2012-11-23 DIAGNOSIS — Y939 Activity, unspecified: Secondary | ICD-10-CM | POA: Insufficient documentation

## 2012-11-23 DIAGNOSIS — J45909 Unspecified asthma, uncomplicated: Secondary | ICD-10-CM | POA: Insufficient documentation

## 2012-11-23 MED ORDER — NAPROXEN 500 MG PO TABS: 500.0000 mg | ORAL_TABLET | Freq: Two times a day (BID) | ORAL | Status: DC

## 2012-11-23 MED ORDER — TRAMADOL HCL 50 MG PO TABS: 50.0000 mg | ORAL_TABLET | Freq: Four times a day (QID) | ORAL | Status: DC | PRN

## 2012-11-23 NOTE — ED Provider Notes (Signed)
History     CSN: [id number]  Arrival date & time 11/23/12  1226   First MD Initiated Contact with Patient 11/23/12 1321      Chief Complaint  Patient presents with  . Neck Pain  . Back Pain  . Shoulder Pain    (Consider location/radiation/quality/duration/timing/severity/associated sxs/prior treatment) Patient is a 22 y.o. female presenting with neck pain, back pain, and shoulder pain. The history is provided by the patient (pt complains of pain in the upper right back and right side of neck worse with movement). No language interpreter was used.  Neck Pain Pain location:  Generalized neck Quality:  Aching Pain radiates to:  R shoulder Pain severity:  Moderate Onset quality:  Gradual Timing:  Constant Progression:  Unchanged Chronicity:  New Context: not fall   Associated symptoms: no chest pain and no headaches   Back Pain Associated symptoms: no abdominal pain, no chest pain and no headaches   Shoulder Pain Pertinent negatives include no chest pain, no abdominal pain and no headaches.    Past Medical History  Diagnosis Date  . ADHD (attention deficit hyperactivity disorder)   . Asthma   . Migraines     sees Dr. Asa Lente  . Anxiety     sees Dr. Evelene Croon   . Depression     sees Dr. Evelene Croon     Past Surgical History  Procedure Laterality Date  . Bladder surgery      No family history on file.  History  Substance Use Topics  . Smoking status: Current Some Day Smoker -- 4 years    Types: Cigarettes  . Smokeless tobacco: Never Used  . Alcohol Use: 0.5 oz/week    1 drink(s) per week     Comment: occ    OB History   Grav Para Term Preterm Abortions TAB SAB Ect Mult Living                  Review of Systems  Constitutional: Negative for fatigue.  HENT: Positive for neck pain. Negative for congestion, sinus pressure and ear discharge.   Eyes: Negative for discharge.  Respiratory: Negative for cough.   Cardiovascular: Negative for chest pain.   Gastrointestinal: Negative for abdominal pain and diarrhea.  Genitourinary: Negative for frequency and hematuria.  Musculoskeletal: Positive for back pain.  Skin: Negative for rash.  Neurological: Negative for seizures and headaches.  Psychiatric/Behavioral: Negative for hallucinations.    Allergies  Review of patient's allergies indicates no known allergies.  Home Medications   Current Outpatient Rx  Name  Route  Sig  Dispense  Refill  . albuterol (PROVENTIL HFA;VENTOLIN HFA) 108 (90 BASE) MCG/ACT inhaler   Inhalation   Inhale 2 puffs into the lungs every 4 (four) hours as needed for wheezing.   1 Inhaler   0   . diazepam (VALIUM) 5 MG tablet   Oral   Take 5 mg by mouth every 6 (six) hours as needed. For anxiety.         . naproxen (NAPROSYN) 500 MG tablet   Oral   Take 1 tablet (500 mg total) by mouth 2 (two) times daily.   20 tablet   0   . traMADol (ULTRAM) 50 MG tablet   Oral   Take 1 tablet (50 mg total) by mouth every 6 (six) hours as needed for pain.   20 tablet   0     BP 116/73  Pulse 67  Temp(Src) 98 F (36.7 C) (Oral)  Resp 18  Ht 5\' 8"  (1.727 m)  Wt 150 lb (68.04 kg)  BMI 22.81 kg/m2  SpO2 100%  LMP 11/21/2012  Physical Exam  Constitutional: She is oriented to person, place, and time. She appears well-developed.  HENT:  Head: Normocephalic and atraumatic.  Mild tender right lateral neck  Eyes: Conjunctivae and EOM are normal. No scleral icterus.  Neck: Neck supple. No thyromegaly present.  Cardiovascular: Normal rate and regular rhythm.  Exam reveals no gallop and no friction rub.   No murmur heard. Pulmonary/Chest: No stridor. She has no wheezes. She has no rales. She exhibits no tenderness.  Abdominal: She exhibits no distension. There is no tenderness. There is no rebound.  Musculoskeletal: Normal range of motion.  Tender right trapezius muscle  Lymphadenopathy:    She has no cervical adenopathy.  Neurological: She is oriented to  person, place, and time. Coordination normal.  Skin: No rash noted. No erythema.  Psychiatric: She has a normal mood and affect. Her behavior is normal.    ED Course  Procedures (including critical care time)  Labs Reviewed - No data to display Dg Chest 2 View  11/23/2012  *RADIOLOGY REPORT*  Clinical Data: Right shoulder pain and right flank pain for 5 days  CHEST - 2 VIEW  Comparison: None.  Findings: No active infiltrate or effusion is seen.  Mediastinal contours appear normal.  The heart is within normal limits in size. No skeletal abnormality is seen.  IMPRESSION: No active lung disease.   Original Report Authenticated By: Dwyane Dee, M.D.      1. Muscle strain       MDM          Benny Lennert, MD 11/23/12 [phone]

## 2012-11-23 NOTE — ED Notes (Signed)
Pt reports  Right side neck, back and shoulder pain. Works at [employer]. 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

## 2012-11-25 ENCOUNTER — Emergency Department (HOSPITAL_COMMUNITY)
Admission: EM | Admit: 2012-11-25 | Discharge: 2012-11-25 | Disposition: A | Discharge status: Home / Self Care | Payer: 59 | Attending: Emergency Medicine | Admitting: Emergency Medicine

## 2012-11-25 ENCOUNTER — Encounter (HOSPITAL_COMMUNITY): Payer: Self-pay | Admitting: Emergency Medicine

## 2012-11-25 ENCOUNTER — Encounter: Payer: Self-pay | Admitting: Internal Medicine

## 2012-11-25 ENCOUNTER — Emergency Department (HOSPITAL_COMMUNITY): Payer: 59

## 2012-11-25 DIAGNOSIS — J45909 Unspecified asthma, uncomplicated: Secondary | ICD-10-CM | POA: Insufficient documentation

## 2012-11-25 DIAGNOSIS — K922 Gastrointestinal hemorrhage, unspecified: Secondary | ICD-10-CM | POA: Insufficient documentation

## 2012-11-25 DIAGNOSIS — F411 Generalized anxiety disorder: Secondary | ICD-10-CM | POA: Insufficient documentation

## 2012-11-25 DIAGNOSIS — Z3202 Encounter for pregnancy test, result negative: Secondary | ICD-10-CM | POA: Insufficient documentation

## 2012-11-25 DIAGNOSIS — F17200 Nicotine dependence, unspecified, uncomplicated: Secondary | ICD-10-CM | POA: Insufficient documentation

## 2012-11-25 DIAGNOSIS — K6289 Other specified diseases of anus and rectum: Secondary | ICD-10-CM | POA: Insufficient documentation

## 2012-11-25 DIAGNOSIS — Z8659 Personal history of other mental and behavioral disorders: Secondary | ICD-10-CM | POA: Insufficient documentation

## 2012-11-25 DIAGNOSIS — Z79899 Other long term (current) drug therapy: Secondary | ICD-10-CM | POA: Insufficient documentation

## 2012-11-25 DIAGNOSIS — F3289 Other specified depressive episodes: Secondary | ICD-10-CM | POA: Insufficient documentation

## 2012-11-25 DIAGNOSIS — Z8679 Personal history of other diseases of the circulatory system: Secondary | ICD-10-CM | POA: Insufficient documentation

## 2012-11-25 DIAGNOSIS — F329 Major depressive disorder, single episode, unspecified: Secondary | ICD-10-CM | POA: Insufficient documentation

## 2012-11-25 LAB — COMPREHENSIVE METABOLIC PANEL
CO2: 27 mEq/L (ref 19–32)
Calcium: 9.2 mg/dL (ref 8.4–10.5)
Creatinine, Ser: 0.63 mg/dL (ref 0.50–1.10)
GFR calc Af Amer: >90 mL/min (ref >90)
GFR calc non Af Amer: >90 mL/min (ref >90)
Glucose, Bld: 81 mg/dL (ref 70–99)

## 2012-11-25 LAB — URINE MICROSCOPIC-ADD ON

## 2012-11-25 LAB — CBC WITH DIFFERENTIAL/PLATELET
Basophils Absolute: 0 10*3/uL (ref 0.0–0.1)
Eosinophils Relative: 1 % (ref 0–5)
HCT: 42.9 % (ref 36.0–46.0)
Lymphocytes Relative: 23 % (ref 12–46)
MCV: 90.5 fL (ref 78.0–100.0)
Monocytes Absolute: 0.3 10*3/uL (ref 0.1–1.0)
RDW: 12.3 % (ref 11.5–15.5)
WBC: 7.2 10*3/uL (ref 4.0–10.5)

## 2012-11-25 LAB — PROTIME-INR
INR: 0.94 (ref 0.00–1.49)
Prothrombin Time: 12.5 seconds (ref 11.6–15.2)

## 2012-11-25 LAB — APTT: aPTT: 30 seconds (ref 24–37)

## 2012-11-25 LAB — URINALYSIS, ROUTINE W REFLEX MICROSCOPIC
Bilirubin Urine: NEGATIVE
Glucose, UA: NEGATIVE mg/dL
Ketones, ur: NEGATIVE mg/dL
pH: 7.5 (ref 5.0–8.0)

## 2012-11-25 MED ORDER — SODIUM CHLORIDE 0.9 % IV SOLN
INTRAVENOUS | Status: DC
  Administered 2012-11-25: 13:00:00 via INTRAVENOUS

## 2012-11-25 MED ORDER — ONDANSETRON HCL 4 MG/2ML IJ SOLN
4.0000 mg | Freq: Once | INTRAMUSCULAR | Status: AC
  Administered 2012-11-25: 4 mg via INTRAVENOUS
  Filled 2012-11-25: qty 2

## 2012-11-25 MED ORDER — IOHEXOL 300 MG/ML  SOLN
100.0000 mL | Freq: Once | INTRAMUSCULAR | Status: AC | PRN
  Administered 2012-11-25: 100 mL via INTRAVENOUS

## 2012-11-25 MED ORDER — IOHEXOL 300 MG/ML  SOLN
50.0000 mL | Freq: Once | INTRAMUSCULAR | Status: AC | PRN
  Administered 2012-11-25: 50 mL via ORAL

## 2012-11-25 MED ORDER — MORPHINE SULFATE 4 MG/ML IJ SOLN
4.0000 mg | Freq: Once | INTRAMUSCULAR | Status: AC
  Administered 2012-11-25: 4 mg via INTRAVENOUS
  Filled 2012-11-25: qty 1

## 2012-11-25 NOTE — ED Notes (Signed)
Pt presents to ED with c/o lower abd pain and bright red rectal bleeding x1 week.  Pt reports that she just left GYN Dr and she's neg for a cyst on ovaries.  Pt also reports that she has had a loss of appetite x1 week and has gained 25 lbs in that last month.

## 2012-11-25 NOTE — ED Provider Notes (Signed)
History     CSN: [id number]  Arrival date & time 11/25/12  1142   First MD Initiated Contact with Patient 11/25/12 1215      Chief Complaint  Patient presents with  . Abdominal Pain  . Rectal Pain    (Consider location/radiation/quality/duration/timing/severity/associated sxs/prior treatment) Patient is a 22 y.o. female presenting with abdominal pain. The history is provided by the patient.  Abdominal Pain  patient here with rectal bleeding x1 week. [facility] GYN Dr. today because of lower abdominal cramping and ultrasound that was negative according to her. She is currently on her menstrual cycle as well. Denies any prior history of this. Has had increased weight gain and 25 pounds. Denies any easy bruising. No vomiting or fever. No history of inflammatory bowel disease  Past Medical History  Diagnosis Date  . ADHD (attention deficit hyperactivity disorder)   . Asthma   . Migraines     sees Dr. Asa Lente  . Anxiety     sees Dr. Evelene Croon   . Depression     sees Dr. Evelene Croon     Past Surgical History  Procedure Laterality Date  . Bladder surgery      No family history on file.  History  Substance Use Topics  . Smoking status: Current Some Day Smoker -- 4 years    Types: Cigarettes  . Smokeless tobacco: Never Used  . Alcohol Use: 0.5 oz/week    1 drink(s) per week     Comment: occ    OB History   Grav Para Term Preterm Abortions TAB SAB Ect Mult Living                  Review of Systems  Gastrointestinal: Positive for abdominal pain.  All other systems reviewed and are negative.    Allergies  Review of patient's allergies indicates no known allergies.  Home Medications   Current Outpatient Rx  Name  Route  Sig  Dispense  Refill  . albuterol (PROVENTIL HFA;VENTOLIN HFA) 108 (90 BASE) MCG/ACT inhaler   Inhalation   Inhale 2 puffs into the lungs every 4 (four) hours as needed for wheezing.   1 Inhaler   0   . diazepam (VALIUM) 5 MG tablet   Oral   Take 5  mg by mouth every 6 (six) hours as needed. For anxiety.         . naproxen (NAPROSYN) 500 MG tablet   Oral   Take 1 tablet (500 mg total) by mouth 2 (two) times daily.   20 tablet   0   . traMADol (ULTRAM) 50 MG tablet   Oral   Take 1 tablet (50 mg total) by mouth every 6 (six) hours as needed for pain.   20 tablet   0     BP 132/63  Pulse 66  Temp(Src) 97.5 F (36.4 C) (Oral)  Resp 20  SpO2 100%  LMP 11/21/2012  Physical Exam  Nursing note and vitals reviewed. Constitutional: She is oriented to person, place, and time. She appears well-developed and well-nourished.  Non-toxic appearance. No distress.  HENT:  Head: Normocephalic and atraumatic.  Eyes: Conjunctivae, EOM and lids are normal. Pupils are equal, round, and reactive to light.  Neck: Normal range of motion. Neck supple. No tracheal deviation present. No mass present.  Cardiovascular: Normal rate, regular rhythm and normal heart sounds.  Exam reveals no gallop.   No murmur heard. Pulmonary/Chest: Effort normal and breath sounds normal. No stridor. No respiratory distress. She  has no decreased breath sounds. She has no wheezes. She has no rhonchi. She has no rales.  Abdominal: Soft. Normal appearance and bowel sounds are normal. She exhibits no distension. There is no tenderness. There is no rigidity, no rebound, no guarding and no CVA tenderness.  Genitourinary: Rectal exam shows no external hemorrhoid and no internal hemorrhoid.  Gross blood on exam  Musculoskeletal: Normal range of motion. She exhibits no edema and no tenderness.  Neurological: She is alert and oriented to person, place, and time. She has normal strength. No cranial nerve deficit or sensory deficit. GCS eye subscore is 4. GCS verbal subscore is 5. GCS motor subscore is 6.  Skin: Skin is warm and dry. No abrasion and no rash noted.  Psychiatric: She has a normal mood and affect. Her speech is normal and behavior is normal.    ED Course   Procedures (including critical care time)  Labs Reviewed - No data to display Dg Chest 2 View  11/23/2012  *RADIOLOGY REPORT*  Clinical Data: Right shoulder pain and right flank pain for 5 days  CHEST - 2 VIEW  Comparison: None.  Findings: No active infiltrate or effusion is seen.  Mediastinal contours appear normal.  The heart is within normal limits in size. No skeletal abnormality is seen.  IMPRESSION: No active lung disease.   Original Report Authenticated By: Dwyane Dee, M.D.      No diagnosis found.    MDM  Patient with stable vital signs and normal abdominal CT. Hemoglobin stable. Have arranged for her to have a followup at [facility] on friday       Toy Baker, MD 11/25/12 1609

## 2012-11-26 ENCOUNTER — Telehealth: Payer: Self-pay | Admitting: Internal Medicine

## 2012-11-26 ENCOUNTER — Encounter: Payer: Self-pay | Admitting: Internal Medicine

## 2012-11-26 NOTE — Telephone Encounter (Signed)
Pt reports rectal bleeding x 1 week; BRB with each BM. She also reports back pain, no appetite and a weight gain of 25 lbs. She went to the ER yesterday where she had a CT scan that was basically normal. Per ER report her problem is not GYN related; states she has U?S at GYN[facility] of[facility]. When asked about constipation, she reports she's never had a "normal stool". She is having her period, but knows the BRB in toilet is coming from her rectum. Offered her an appt this am, but she states she has to work, she only wants to make sure she can be seen 11/28/12. Advised her if we are closed or delayed, someone should inform her. Pt stated understanding.

## 2012-11-28 ENCOUNTER — Ambulatory Visit: Payer: 59 | Admitting: Internal Medicine

## 2012-11-29 ENCOUNTER — Other Ambulatory Visit: Payer: Self-pay

## 2012-12-01 ENCOUNTER — Encounter: Payer: Self-pay | Admitting: Gastroenterology

## 2012-12-01 ENCOUNTER — Ambulatory Visit (INDEPENDENT_AMBULATORY_CARE_PROVIDER_SITE_OTHER): Payer: 59 | Admitting: Physician Assistant

## 2012-12-01 ENCOUNTER — Encounter: Payer: Self-pay | Admitting: Physician Assistant

## 2012-12-01 VITALS — BP 100/70 | HR 71 | Ht 68.25 in | Wt 148.2 lb

## 2012-12-01 DIAGNOSIS — K625 Hemorrhage of anus and rectum: Secondary | ICD-10-CM

## 2012-12-01 DIAGNOSIS — R1031 Right lower quadrant pain: Secondary | ICD-10-CM

## 2012-12-01 MED ORDER — NA SULFATE-K SULFATE-MG SULF 17.5-3.13-1.6 GM/177ML PO SOLN: 1.0000 | Freq: Once | ORAL | Status: DC

## 2012-12-01 NOTE — Patient Instructions (Addendum)
You have been scheduled for a colonoscopy with propofol. Please follow written instructions given to you at your visit today.  We have given you a sample of the colonoscopy prep called Suprep.  If you use inhalers (even only as needed) or a CPAP machine, please bring them with you on the day of your procedure.

## 2012-12-01 NOTE — Progress Notes (Signed)
Subjective:    Patient ID: DANAYSIA RADER, female    DOB: April 18, 1991, 22 y.o.   MRN: [id number]  HPI  Dorcas is a pleasant generally healthy 22 year old female who has history of ADHD/depression/anxiety. She has not had any prior GI evaluation. She says she has had some chronic problems with constipation since she was a child but generally does not require laxatives. She says she has been having persistent lower abdominal pain and cramping over the past 2-1/2 weeks which has been more concentrated in the right lower quadrant. She has not noticed any change in her bowel habits and generally has a bowel movement about every other day. Around that time the pain started she also began noticing bright red blood both mixed in with the bowel movement and also in the commode. She has not had any rectal discomfort or hemorrhoidal symptoms. Her appetite has been good and she's actually been gaining weight- apparently 20 pounds over the past couple of months for no clear reason. She has not had any associated fever, chills, nausea or vomiting. She had an ER visit on 29 and again on 11/25/2012 because of the pain and bleeding. She has been given hydrocodone to use for pain which is helping. Labs done in the emergency room on 2/11 showed a hemoglobin of 15 hematocrit of 42.9 MCV 90.5, electrolytes within normal limits and beta hCG was negative. CT of the abdomen and pelvis was also done on 2/11 with contrast and this was unremarkable. She had initially gone to her gynecologist with pain and has had a recent pelvic ultrasound which she was told was negative . Family history is negative for inflammatory bowel disease or other GI diseases as far she is aware.     Review of Systems  Constitutional: Positive for unexpected weight change.  HENT: Negative.   Eyes: Negative.   Respiratory: Negative.   Cardiovascular: Negative.   Gastrointestinal: Positive for abdominal pain and blood in stool.  Endocrine:  Negative.   Genitourinary: Negative.   Allergic/Immunologic: Negative.   Neurological: Negative.   Hematological: Negative.   Psychiatric/Behavioral: Negative.    Outpatient Prescriptions Prior to Visit  Medication Sig Dispense Refill  . albuterol (PROVENTIL HFA;VENTOLIN HFA) 108 (90 BASE) MCG/ACT inhaler Inhale 2 puffs into the lungs every 4 (four) hours as needed for wheezing.  1 Inhaler  0  . diazepam (VALIUM) 5 MG tablet Take 5 mg by mouth every 6 (six) hours as needed. For anxiety.      . naproxen (NAPROSYN) 500 MG tablet Take 1 tablet (500 mg total) by mouth 2 (two) times daily.  20 tablet  0   No facility-administered medications prior to visit.   Allergies  Allergen Reactions  . Tramadol Other (See Comments)    heart races     Patient Active Problem List  Diagnosis  . ANXIETY  . DEPRESSION  . ADD  . MIGRAINE HEADACHE  . DYSFUNCTION OF EUSTACHIAN TUBE  . ACUTE SINUSITIS, UNSPECIFIED  . VIRAL URI  . ASTHMA  . PARESTHESIA  . CERVICAL STRAIN, ACUTE  . UTI  . BACTERIAL VAGINITIS  . Acute otitis media, bilateral  . Active smoker  . URI, acute   History  Substance Use Topics  . Smoking status: Current Some Day Smoker -- 4 years    Types: Cigarettes  . Smokeless tobacco: Never Used  . Alcohol Use: 0.5 oz/week    1 drink(s) per week     Comment: occ   family history includes Diabetes  in her maternal grandmother.     Objective:   Physical Exam well-developed young white female in no acute distress, pleasant blood pressure 100/70 pulse 70 one height 5 foot 8 weight 148. HEENT; nontraumatic normocephalic EOMI PERRLA sclera anicteric,Neck; Supple no JVD, Cardiovascular; regular rate and rhythm with S1-S2 no murmur or gallop, Pulmonary; clear bilaterally, Abdomen; soft nondistended she's mildly tender in the right lower quadrant and suprapubic area no guarding or rebound no palpable mass or hepatosplenomegaly, Rectal; exam not repeated this was done in the emergency room  on 11/25/2012 and showed gross blood on the examining glove without hemorrhoids., Extremities; no clubbing cyanosis or edema skin warm and dry, Psych; mood and affect normal and appropriate.        Assessment & Plan:  #108  22 year old female with 2-1/2 week history of persistent crampy and sometimes sharp lower abdominal  Pain- primarily right-sided and bright red blood mixed with bowel movements. Recent ER evaluation with normal CBC and negative CT of the abdomen and pelvis.  Though CT was negative will still need to rule out inflammatory bowel disease i.e. Crohn's disease or ulcerative colitis, proctitis, and/or occult lesion to account for her bleeding  #2 chronic anxiety/depression and ADD  Plan; patient will be scheduled for colonoscopy with Dr. Arlyce Dice with propofol. Procedure discussed in detail with the patient and she is agreeable to proceed. She will use hydrocodone as needed for pain in the interim Further plans pending results of colonoscopy.

## 2012-12-03 NOTE — Progress Notes (Signed)
Reviewed and agree with management. Tamra Koos D. Talaya Lamprecht, M.D., FACG  

## 2012-12-04 ENCOUNTER — Encounter: Payer: Self-pay | Admitting: Gastroenterology

## 2012-12-04 ENCOUNTER — Encounter: Payer: 59 | Admitting: Gastroenterology

## 2012-12-04 ENCOUNTER — Ambulatory Visit (AMBULATORY_SURGERY_CENTER): Payer: 59 | Admitting: Gastroenterology

## 2012-12-04 VITALS — BP 104/44 | HR 62 | Temp 96.8°F | Resp 17 | Ht 68.0 in | Wt 148.0 lb

## 2012-12-04 DIAGNOSIS — R1031 Right lower quadrant pain: Secondary | ICD-10-CM

## 2012-12-04 DIAGNOSIS — K648 Other hemorrhoids: Secondary | ICD-10-CM

## 2012-12-04 DIAGNOSIS — K625 Hemorrhage of anus and rectum: Secondary | ICD-10-CM

## 2012-12-04 MED ORDER — SODIUM CHLORIDE 0.9 % IV SOLN: 500.0000 mL | INTRAVENOUS | Status: DC

## 2012-12-04 MED ORDER — HYOSCYAMINE SULFATE ER 0.375 MG PO TBCR: EXTENDED_RELEASE_TABLET | ORAL | Status: DC

## 2012-12-04 NOTE — Progress Notes (Signed)
Patient did not experience any of the following events: a burn prior to discharge; a fall within the facility; wrong site/side/patient/procedure/implant event; or a hospital transfer or hospital admission upon discharge from the facility. (G8907)Patient did not have preoperative order for IV antibiotic SSI prophylaxis. (G8918) ewm 

## 2012-12-04 NOTE — Op Note (Signed)
[facility] [address] [place], [address]   COLONOSCOPY PROCEDURE REPORT  PATIENT: Brooke, Castaneda  MR#: [id number] BIRTHDATE: 1991/01/01 , 21  yrs. old GENDER: Female ENDOSCOPIST: Louis Meckel, MD REFERRED [id number] Clent Ridges, M.D. PROCEDURE DATE:  12/04/2012 PROCEDURE:   Colonoscopy, diagnostic ASA CLASS:   Class I INDICATIONS:rectal bleeding. MEDICATIONS: MAC sedation, administered by CRNA and propofol (Diprivan) 300mg  IV  DESCRIPTION OF PROCEDURE:   After the risks benefits and alternatives of the procedure were thoroughly explained, informed consent was obtained.  A digital rectal exam revealed no abnormalities of the rectum.   The LB [id number]-[id number]  endoscope was introduced through the anus and advanced to the terminal ileum which was intubated for a short distance. No adverse events experienced.   The quality of the prep was Suprep excellent  The instrument was then slowly withdrawn as the colon was fully examined.      COLON FINDINGS: The mucosa appeared normal in the terminal ileum. A normal appearing cecum, ileocecal valve, and appendiceal orifice were identified.  The ascending, hepatic flexure, transverse, splenic flexure, descending, sigmoid colon and rectum appeared unremarkable.  No polyps or cancers were seen.  Retroflexed views revealed no abnormalities. The time to cecum=2 minutes 34 seconds. Withdrawal time=6 minutes 14 seconds.  The scope was withdrawn and the procedure completed. COMPLICATIONS: There were no complications.  ENDOSCOPIC IMPRESSION: 1.   Normal mucosa in the terminal ileum 2.   Normal colon  Limited rectal bleeding secondary to hemorrhoids  RECOMMENDATIONS: 1.  Hyomax as needed for abdominal pain    eSigned:  Louis Meckel, MD 12/04/2012 11:37 AM   cc:

## 2012-12-04 NOTE — Progress Notes (Signed)
Lidocaine-40mg IV prior to Propofol InductionPropofol given over incremental dosages 

## 2012-12-04 NOTE — Progress Notes (Signed)
Patient requesting to take a Valium 5 mg with a sip of water, stating she was told during office visit she could take this medication prior to procedure. Discussed with Brennan Bailey CRNA, patient informed not to take medication. Patient accepting of instruction.

## 2012-12-04 NOTE — Progress Notes (Signed)
Patient had an IUD inserted  12/03/12, patient experiencing light vaginal bleeding.

## 2012-12-04 NOTE — Patient Instructions (Addendum)
YOU HAD AN ENDOSCOPIC PROCEDURE TODAY AT [facility]: Refer to the procedure report that was given to you for any specific questions about what was found during the examination.  If the procedure report does not answer your questions, please call your gastroenterologist to clarify.  If you requested that your care partner not be given the details of your procedure findings, then the procedure report has been included in a sealed envelope for you to review at your convenience later.  YOU SHOULD EXPECT: Some feelings of bloating in the abdomen. Passage of more gas than usual.  Walking can help get rid of the air that was put into your GI tract during the procedure and reduce the bloating. If you had a lower endoscopy (such as a colonoscopy or flexible sigmoidoscopy) you may notice spotting of blood in your stool or on the toilet paper. If you underwent a bowel prep for your procedure, then you may not have a normal bowel movement for a few days.  DIET: Your first meal following the procedure should be a light meal and then it is ok to progress to your normal diet.  A half-sandwich or bowl of soup is an example of a good first meal.  Heavy or fried foods are harder to digest and may make you feel nauseous or bloated.  Likewise meals heavy in dairy and vegetables can cause extra gas to form and this can also increase the bloating.  Drink plenty of fluids but you should avoid alcoholic beverages for 24 hours.  ACTIVITY: Your care partner should take you home directly after the procedure.  You should plan to take it easy, moving slowly for the rest of the day.  You can resume normal activity the day after the procedure however you should NOT DRIVE or use heavy machinery for 24 hours (because of the sedation medicines used during the test).    SYMPTOMS TO REPORT IMMEDIATELY: A gastroenterologist can be reached at any hour.  During normal business hours, 8:30 AM to 5:00 PM Monday through Friday,  call [phone].  After hours and on weekends, please call the GI answering service at [phone]  Emergency number who will take a message and have the physician on call contact you.   Following lower endoscopy (colonoscopy or flexible sigmoidoscopy):  Excessive amounts of blood in the stool  Significant tenderness or worsening of abdominal pains  Swelling of the abdomen that is new, acute  Fever of 100F or higher FOLLOW UP: If any biopsies were taken you will be contacted by phone or by letter within the next 1-3 weeks.  Call your gastroenterologist if you have not heard about the biopsies in 3 weeks.  Our staff will call the home number listed on your records the next business day following your procedure to check on you and address any questions or concerns that you may have at that time regarding the information given to you following your procedure. This is a courtesy call and so if there is no answer at the home number and we have not heard from you through the emergency physician on call, we will assume that you have returned to your regular daily activities without incident.  SIGNATURES/CONFIDENTIALITY: You and/or your care partner have signed paperwork which will be entered into your electronic medical record.  These signatures attest to the fact that that the information above on your After Visit Summary has been reviewed and is understood.  Full responsibility of the confidentiality  of this discharge information lies with you and/or your care-partner.  Dr Arlyce Dice gave you a medicine called hyomax for abdominal pain ti use as needed

## 2012-12-05 ENCOUNTER — Telehealth: Payer: Self-pay | Admitting: *Deleted

## 2012-12-05 NOTE — Telephone Encounter (Signed)
  Follow up Call-  Call back number 12/04/2012  Post procedure Call Back phone  # [phone]  Permission to leave phone message Yes     Patient questions:  Do you have a fever, pain , or abdominal swelling? no Pain Score  0 *  Have you tolerated food without any problems? yes  Have you been able to return to your normal activities? yes  Do you have any questions about your discharge instructions: Diet   no Medications  no Follow up visit  no  Do you have questions or concerns about your Care? no  Actions: * If pain score is 4 or above: No action needed, pain <4.

## 2013-01-19 ENCOUNTER — Ambulatory Visit (INDEPENDENT_AMBULATORY_CARE_PROVIDER_SITE_OTHER): Payer: 59 | Admitting: Family Medicine

## 2013-01-19 ENCOUNTER — Encounter: Payer: Self-pay | Admitting: Family Medicine

## 2013-01-19 VITALS — BP 108/64 | HR 104 | Temp 98.9°F | Wt 146.0 lb

## 2013-01-19 DIAGNOSIS — S93409A Sprain of unspecified ligament of unspecified ankle, initial encounter: Secondary | ICD-10-CM

## 2013-01-19 NOTE — Progress Notes (Signed)
  Subjective:    Patient ID: Brooke Castaneda, female    DOB: 04-24-1991, 22 y.o.   MRN: [id number]  HPI Here for an injury to her left ankle which occurred at home yesterday evening. As she was walking down some stairs she slipped and twisted the ankle. She heard and felt a pop and has had pain and swelling in the lateral ankle since then. She has not applied ice. She is wearing a brace.    Review of Systems  Constitutional: Negative.   Musculoskeletal: Positive for joint swelling and arthralgias.       Objective:   Physical Exam  Constitutional:  In pain, limping   Musculoskeletal:  The left lateral ankle is swollen and she is quite tender over the distal tip of the fibula. No tenderness inferior to this or in the foot. Full ROM           Assessment & Plan:  Probable avulsion fracture to the fibula. We will arrange for her to see Orthopedics today.

## 2013-02-17 ENCOUNTER — Ambulatory Visit (INDEPENDENT_AMBULATORY_CARE_PROVIDER_SITE_OTHER): Payer: 59 | Admitting: Family Medicine

## 2013-02-17 ENCOUNTER — Encounter: Payer: Self-pay | Admitting: Family Medicine

## 2013-02-17 VITALS — BP 110/64 | HR 94 | Temp 98.2°F | Wt 146.0 lb

## 2013-02-17 DIAGNOSIS — F988 Other specified behavioral and emotional disorders with onset usually occurring in childhood and adolescence: Secondary | ICD-10-CM

## 2013-02-17 DIAGNOSIS — F329 Major depressive disorder, single episode, unspecified: Secondary | ICD-10-CM

## 2013-02-17 DIAGNOSIS — F411 Generalized anxiety disorder: Secondary | ICD-10-CM

## 2013-02-17 MED ORDER — AMPHETAMINE-DEXTROAMPHETAMINE 10 MG PO TABS: 10.0000 mg | ORAL_TABLET | Freq: Two times a day (BID) | ORAL | Status: DC

## 2013-02-17 NOTE — Progress Notes (Signed)
  Subjective:    Patient ID: Brooke Castaneda, female    DOB: 1991/07/25, 22 y.o.   MRN: [id number]  HPI Here asking to get back on Adderall to help her focus. She is due to see Dr. Evelene Croon again in July. Her GYN gave her a rx for Lexapro 10 mg daily when she saw her yesterday.    Review of Systems  Constitutional: Negative.   Neurological: Negative.   Psychiatric/Behavioral: Positive for decreased concentration. Negative for confusion.       Objective:   Physical Exam  Constitutional: She is oriented to person, place, and time. She appears well-developed and well-nourished.  Neurological: She is alert and oriented to person, place, and time.  Psychiatric: She has a normal mood and affect. Her behavior is normal. Thought content normal.          Assessment & Plan:  Wrote for Adderall again. Urged to start the Lexapro as well. Follow up with Dr. Evelene Croon.

## 2013-03-17 ENCOUNTER — Inpatient Hospital Stay (HOSPITAL_COMMUNITY): Payer: 59

## 2013-03-17 ENCOUNTER — Inpatient Hospital Stay (HOSPITAL_COMMUNITY)
Admission: AD | Admit: 2013-03-17 | Discharge: 2013-03-18 | Disposition: A | Discharge status: Home / Self Care | Payer: 59 | Source: Ambulatory Visit | Attending: Obstetrics and Gynecology | Admitting: Obstetrics and Gynecology

## 2013-03-17 ENCOUNTER — Encounter (HOSPITAL_COMMUNITY): Payer: Self-pay | Admitting: *Deleted

## 2013-03-17 DIAGNOSIS — N760 Acute vaginitis: Secondary | ICD-10-CM | POA: Insufficient documentation

## 2013-03-17 DIAGNOSIS — T8389XA Other specified complication of genitourinary prosthetic devices, implants and grafts, initial encounter: Secondary | ICD-10-CM

## 2013-03-17 DIAGNOSIS — B9689 Other specified bacterial agents as the cause of diseases classified elsewhere: Secondary | ICD-10-CM | POA: Insufficient documentation

## 2013-03-17 DIAGNOSIS — A499 Bacterial infection, unspecified: Secondary | ICD-10-CM | POA: Insufficient documentation

## 2013-03-17 DIAGNOSIS — Z30431 Encounter for routine checking of intrauterine contraceptive device: Secondary | ICD-10-CM | POA: Insufficient documentation

## 2013-03-17 DIAGNOSIS — R109 Unspecified abdominal pain: Secondary | ICD-10-CM | POA: Insufficient documentation

## 2013-03-17 DIAGNOSIS — N3010 Interstitial cystitis (chronic) without hematuria: Secondary | ICD-10-CM | POA: Insufficient documentation

## 2013-03-17 LAB — CBC
Hemoglobin: 13.3 g/dL (ref 12.0–15.0)
MCH: 31.1 pg (ref 26.0–34.0)
MCHC: 34.3 g/dL (ref 30.0–36.0)
RDW: 13 % (ref 11.5–15.5)

## 2013-03-17 LAB — POCT PREGNANCY, URINE: Preg Test, Ur: NEGATIVE

## 2013-03-17 LAB — WET PREP, GENITAL

## 2013-03-17 LAB — URINALYSIS, ROUTINE W REFLEX MICROSCOPIC
Leukocytes, UA: NEGATIVE
Nitrite: NEGATIVE
Specific Gravity, Urine: 1.025 (ref 1.005–1.030)
Urobilinogen, UA: 0.2 mg/dL (ref 0.0–1.0)
pH: 6 (ref 5.0–8.0)

## 2013-03-17 LAB — URINE MICROSCOPIC-ADD ON

## 2013-03-17 MED ORDER — OXYCODONE-ACETAMINOPHEN 5-325 MG PO TABS
2.0000 | ORAL_TABLET | Freq: Once | ORAL | Status: AC
  Administered 2013-03-17: 2 via ORAL
  Filled 2013-03-17: qty 2

## 2013-03-17 NOTE — MAU Note (Signed)
Lower abdominal pain that is crampy, started last night. Bright red bleeding last night, has turned to brown blood today. Nauseated last few hours. Has skyla IUD, placed 12/03/12.

## 2013-03-17 NOTE — MAU Note (Signed)
Pt states she has been in pain for the last 4 months. Pt states her pain alternates from one side to the other. Pt states she was told "they saw fluid and BV"

## 2013-03-17 NOTE — MAU Provider Note (Signed)
Chief Complaint: Abdominal Pain    First Provider Initiated Contact with Patient 03/17/13 2209     SUBJECTIVE HPI: Brooke Castaneda is a 22 y.o.  G0 female who presents to MAU reporting low abd cramping and BRB since last night. No regular periods since Bradley IUD inserted in February. Also reports mild nausea today. Denies fever, chills, vaginal discharge, urinary complaints, vomiting, diarrhea, constipation.   Past Medical History  Diagnosis Date  . ADHD (attention deficit hyperactivity disorder)   . Asthma   . Migraines     sees Dr. Asa Lente  . Anxiety     sees Dr. Evelene Croon   . Depression     sees Dr. Evelene Croon    OB History   Jaymes Graff Para Term Preterm Abortions TAB SAB Ect Mult Living                 Past Surgical History  Procedure Laterality Date  . Bladder surgery  2012   History   Social History  . Marital Status: Single    Spouse Name: N/A    Number of Children: 0  . Years of Education: N/A   Occupational History  .     Social History Main Topics  . Smoking status: Current Some Day Smoker -- 1.00 packs/day for 4 years    Types: Cigarettes  . Smokeless tobacco: Never Used  . Alcohol Use: 0.0 oz/week     Comment: glass of wine every other day  . Drug Use: No  . Sexually Active: Yes    Birth Control/ Protection: Skyla IUD    Other Topics Concern  . Not on file   Social History Narrative  . No narrative on file   No current facility-administered medications on file prior to encounter.   Current Outpatient Prescriptions on File Prior to Encounter  Medication Sig Dispense Refill  . albuterol (PROVENTIL HFA;VENTOLIN HFA) 108 (90 BASE) MCG/ACT inhaler Inhale 2 puffs into the lungs every 4 (four) hours as needed for wheezing.  1 Inhaler  0  . amphetamine-dextroamphetamine (ADDERALL) 10 MG tablet Take 1 tablet (10 mg total) by mouth 2 (two) times daily.  60 tablet  0  . diazepam (VALIUM) 5 MG tablet Take 5 mg by mouth every 6 (six) hours as needed. For anxiety.       Marland Kitchen escitalopram (LEXAPRO) 10 MG tablet Take 10 mg by mouth daily.      Marland Kitchen HYDROcodone-acetaminophen (NORCO/VICODIN) 5-325 MG per tablet Take 1 tablet by mouth every 4 (four) hours as needed for pain.      . Levonorgestrel (SKYLA) 13.5 MG IUD by Intrauterine route.       Allergies  Allergen Reactions  . Tramadol Other (See Comments)    heart races    ROS: Pertinent items in HPI  OBJECTIVE Blood pressure 116/74, pulse 89, temperature 98.6 F (37 C), temperature source Oral, resp. rate 16, height 5\' 8"  (1.727 m), weight 68.856 kg (151 lb 12.8 oz), SpO2 100.00%. GENERAL: Well-developed, well-nourished female in no acute distress. Anxious.  HEENT: Normocephalic HEART: normal rate RESP: normal effort ABDOMEN: Soft, non-tender. Pos BS x 4.  EXTREMITIES: Nontender, no edema NEURO: Alert and oriented SPECULUM EXAM: NEFG, small amount of dark red blood noted, cervix clean. Strings seen.  BIMANUAL: cervix closed; uterus normal size, no adnexal tenderness or masses. No CMT.   LAB RESULTS Results for orders placed during the hospital encounter of 03/17/13 (from the past 24 hour(s))  URINALYSIS, ROUTINE W REFLEX MICROSCOPIC  Status: Abnormal   Collection Time    03/17/13  9:05 PM      Result Value Range   Color, Urine YELLOW  YELLOW   APPearance CLEAR  CLEAR   Specific Gravity, Urine 1.025  1.005 - 1.030   pH 6.0  5.0 - 8.0   Glucose, UA NEGATIVE  NEGATIVE mg/dL   Hgb urine dipstick LARGE (*) NEGATIVE   Bilirubin Urine NEGATIVE  NEGATIVE   Ketones, ur NEGATIVE  NEGATIVE mg/dL   Protein, ur NEGATIVE  NEGATIVE mg/dL   Urobilinogen, UA 0.2  0.0 - 1.0 mg/dL   Nitrite NEGATIVE  NEGATIVE   Leukocytes, UA NEGATIVE  NEGATIVE  URINE MICROSCOPIC-ADD ON     Status: Abnormal   Collection Time    03/17/13  9:05 PM      Result Value Range   Squamous Epithelial / LPF FEW (*) RARE   WBC, UA 0-2  <3 WBC/hpf   RBC / HPF 3-6  <3 RBC/hpf   Bacteria, UA FEW (*) RARE  POCT PREGNANCY, URINE      Status: None   Collection Time    03/17/13  9:25 PM      Result Value Range   Preg Test, Ur NEGATIVE  NEGATIVE  CBC     Status: None   Collection Time    03/17/13 10:10 PM      Result Value Range   WBC 8.2  4.0 - 10.5 K/uL   RBC 4.27  3.87 - 5.11 MIL/uL   Hemoglobin 13.3  12.0 - 15.0 g/dL   HCT 16.1  09.6 - 04.5 %   MCV 90.9  78.0 - 100.0 fL   MCH 31.1  26.0 - 34.0 pg   MCHC 34.3  30.0 - 36.0 g/dL   RDW 40.9  81.1 - 91.4 %   Platelets 282  150 - 400 K/uL  WET PREP, GENITAL     Status: Abnormal   Collection Time    03/17/13 10:25 PM      Result Value Range   Yeast Wet Prep HPF POC NONE SEEN  NONE SEEN   Trich, Wet Prep NONE SEEN  NONE SEEN   Clue Cells Wet Prep HPF POC FEW (*) NONE SEEN   WBC, Wet Prep HPF POC FEW (*) NONE SEEN    IMAGING US Transvaginal Non-ob  03/17/2013   *RADIOLOGY REPORT*  Clinical Data: Menorrhagia.  IUD previously placed 2014 February.  TRANSABDOMINAL AND TRANSVAGINAL ULTRASOUND OF PELVIS  Technique:  Both transabdominal and transvaginal ultrasound examinations of the pelvis were performed.  Transabdominal technique was performed for global imaging of the pelvis including uterus, ovaries, adnexal regions, and pelvic cul-de-sac.  It was necessary to proceed with endovaginal exam following the transabdominal exam to visualize the endometrium and ovaries, IUD position.  Comparison:  CT 11/25/2012  Findings: Uterus:  6.0 x 3.6 x 2.8 cm.  Anteverted, anteflexed.  No focal abnormality.  Endometrium: 5 mm.  IUD visualized within the uterine body endometrial canal and limbs at the level of the fundal endometrial canal, normal in position according to 3-D imaging.  Right ovary: 3.0 x 2.7 x 2.1 cm.  Normal.  Left ovary: 2.9 x 2.5 x 1.6 cm.  Normal.  Other Findings:  No free fluid  IMPRESSION: No acute abnormality.  IUD appropriately located within the uterine fundal/body endometrial canal.   Original Report Authenticated By: Christiana Pellant, M.D.   US Pelvis  Complete  03/17/2013   *RADIOLOGY REPORT*  Clinical Data: Menorrhagia.  IUD previously placed 2014 February.  TRANSABDOMINAL AND TRANSVAGINAL ULTRASOUND OF PELVIS  Technique:  Both transabdominal and transvaginal ultrasound examinations of the pelvis were performed.  Transabdominal technique was performed for global imaging of the pelvis including uterus, ovaries, adnexal regions, and pelvic cul-de-sac.  It was necessary to proceed with endovaginal exam following the transabdominal exam to visualize the endometrium and ovaries, IUD position.  Comparison:  CT 11/25/2012  Findings: Uterus:  6.0 x 3.6 x 2.8 cm.  Anteverted, anteflexed.  No focal abnormality.  Endometrium: 5 mm.  IUD visualized within the uterine body endometrial canal and limbs at the level of the fundal endometrial canal, normal in position according to 3-D imaging.  Right ovary: 3.0 x 2.7 x 2.1 cm.  Normal.  Left ovary: 2.9 x 2.5 x 1.6 cm.  Normal.  Other Findings:  No free fluid  IMPRESSION: No acute abnormality.  IUD appropriately located within the uterine fundal/body endometrial canal.   Original Report Authenticated By: Christiana Pellant, M.D.   MAU COURSE  ASSESSMENT 1. IUD complication, initial encounter   2. BV (bacterial vaginosis)   Probable menses.   PLAN Discharge home in stable condition.  GC/CT, urine culture pending.      Follow-up Information   Follow up with [facility], [place].. (As needed if symptoms worsen)    Contact information:   [address] [place] [address] [phone]       Medication List    STOP taking these medications       Hyoscyamine Sulfate 0.375 MG Tbcr      TAKE these medications       albuterol 108 (90 BASE) MCG/ACT inhaler  Commonly known as:  PROVENTIL HFA;VENTOLIN HFA  Inhale 2 puffs into the lungs every 4 (four) hours as needed for wheezing.     amphetamine-dextroamphetamine 10 MG tablet  Commonly known as:  ADDERALL  Take 1 tablet  (10 mg total) by mouth 2 (two) times daily.     diazepam 5 MG tablet  Commonly known as:  VALIUM  Take 5 mg by mouth every 6 (six) hours as needed. For anxiety.     escitalopram 10 MG tablet  Commonly known as:  LEXAPRO  Take 10 mg by mouth daily.     HYDROcodone-acetaminophen 5-325 MG per tablet  Commonly known as:  NORCO/VICODIN  Take 1 tablet by mouth every 4 (four) hours as needed for pain.     ketorolac 10 MG tablet  Commonly known as:  TORADOL  Take 1 tablet (10 mg total) by mouth every 6 (six) hours as needed for pain.     metroNIDAZOLE 500 MG tablet  Commonly known as:  FLAGYL  Take 1 tablet (500 mg total) by mouth 2 (two) times daily.     OLANZapine-FLUoxetine 3-25 MG per capsule  Commonly known as:  SYMBYAX  Take 1 capsule by mouth every evening.     oxyCODONE-acetaminophen 5-325 MG per tablet  Commonly known as:  PERCOCET/ROXICET  Take 1 tablet by mouth every 4 (four) hours as needed for pain.     SKYLA 13.5 MG Iud  Generic drug:  Levonorgestrel  by Intrauterine route.         [place], [place] 03/18/2013  12:27 AM

## 2013-03-17 NOTE — Progress Notes (Signed)
Pt states she has had very soft stool

## 2013-03-18 DIAGNOSIS — R109 Unspecified abdominal pain: Secondary | ICD-10-CM

## 2013-03-18 DIAGNOSIS — A499 Bacterial infection, unspecified: Secondary | ICD-10-CM

## 2013-03-18 DIAGNOSIS — N760 Acute vaginitis: Secondary | ICD-10-CM

## 2013-03-18 DIAGNOSIS — Z30431 Encounter for routine checking of intrauterine contraceptive device: Secondary | ICD-10-CM

## 2013-03-18 LAB — GC/CHLAMYDIA PROBE AMP
CT Probe RNA: NEGATIVE
GC Probe RNA: NEGATIVE

## 2013-03-18 MED ORDER — KETOROLAC TROMETHAMINE 10 MG PO TABS: 10.0000 mg | ORAL_TABLET | Freq: Four times a day (QID) | ORAL | Status: DC | PRN

## 2013-03-18 MED ORDER — METRONIDAZOLE 500 MG PO TABS: 500.0000 mg | ORAL_TABLET | Freq: Two times a day (BID) | ORAL | Status: DC

## 2013-03-19 LAB — URINE CULTURE: Colony Count: NO GROWTH

## 2013-04-08 ENCOUNTER — Ambulatory Visit (INDEPENDENT_AMBULATORY_CARE_PROVIDER_SITE_OTHER): Payer: 59 | Admitting: Gastroenterology

## 2013-04-08 ENCOUNTER — Encounter: Payer: Self-pay | Admitting: Gastroenterology

## 2013-04-08 VITALS — BP 98/70 | HR 68 | Ht 68.0 in | Wt 152.2 lb

## 2013-04-08 DIAGNOSIS — R1031 Right lower quadrant pain: Secondary | ICD-10-CM

## 2013-04-08 MED ORDER — HYOSCYAMINE SULFATE ER 0.375 MG PO TBCR: EXTENDED_RELEASE_TABLET | ORAL | Status: DC

## 2013-04-08 NOTE — Progress Notes (Signed)
History of Present Illness:  The patient has returned for reevaluation of abdominal pain. Colonoscopy was unremarkable. Limited rectal bleeding was attributed to hemorrhoids. She's had no further bleeding. She continues to complain of lower abdomenal pain. Pain is more frequent on the right than the left; it may occur on either side. It is moderately severe and may last hours at a time. It is unaffected by eating or bowel movements. While she has a tendency to be constipated, pain is unaffected by bowel movements. She's had pain even in the face of diarrhea. GYN evaluation was negative including pelvic and transvaginal ultrasound. She denies dysuria or urinary frequency.  Pain began around the time of an IUD insertion.    Review of Systems: Pertinent positive and negative review of systems were noted in the above HPI section. All other review of systems were otherwise negative.    Current Medications, Allergies, Past Medical History, Past Surgical History, Family History and Social History were reviewed in [employer] electronic medical record  Vital signs were reviewed in today's medical record. Physical Exam: General: Well developed , well nourished, no acute distress Skin: anicteric Head: Normocephalic and atraumatic Eyes:  sclerae anicteric, EOMI Ears: Normal auditory acuity Mouth: No deformity or lesions Lungs: Clear throughout to auscultation Heart: Regular rate and rhythm; no murmurs, rubs or bruits Abdomen: Soft,and non distended. No masses, hepatosplenomegaly or hernias noted. Normal Bowel sounds. There is mild tenderness in the suprapubic area Rectal:deferred Musculoskeletal: Symmetrical with no gross deformities  Pulses:  Normal pulses noted Extremities: No clubbing, cyanosis, edema or deformities noted Neurological: Alert oriented x 4, grossly nonfocal Psychological:  Alert and cooperative. Normal mood and affect

## 2013-04-08 NOTE — Assessment & Plan Note (Signed)
Persistent pain in the suprapubic area both in the left to right lower quadrants is temporally related to IUD insertion. I am suspicious that pain is more likely related to this than GI origin.  Recommendations #1 trial of hyomax. It symptoms are not improved I would consider removal of IUD and reassessment thereafter

## 2013-04-08 NOTE — Patient Instructions (Addendum)
You have been given a separate informational sheet regarding your tobacco use, the importance of quitting and local resources to help you quit. We are sending in a prescription to your pharmacy If no better in 5 days consider removal of your IUD

## 2013-05-10 ENCOUNTER — Emergency Department (HOSPITAL_COMMUNITY)
Admission: EM | Admit: 2013-05-10 | Discharge: 2013-05-11 | Disposition: A | Discharge status: Home / Self Care | Payer: 59 | Attending: Emergency Medicine | Admitting: Emergency Medicine

## 2013-05-10 ENCOUNTER — Encounter (HOSPITAL_COMMUNITY): Payer: Self-pay | Admitting: *Deleted

## 2013-05-10 DIAGNOSIS — Z8659 Personal history of other mental and behavioral disorders: Secondary | ICD-10-CM | POA: Insufficient documentation

## 2013-05-10 DIAGNOSIS — Z8679 Personal history of other diseases of the circulatory system: Secondary | ICD-10-CM | POA: Insufficient documentation

## 2013-05-10 DIAGNOSIS — F329 Major depressive disorder, single episode, unspecified: Secondary | ICD-10-CM | POA: Insufficient documentation

## 2013-05-10 DIAGNOSIS — F17200 Nicotine dependence, unspecified, uncomplicated: Secondary | ICD-10-CM | POA: Insufficient documentation

## 2013-05-10 DIAGNOSIS — Z79899 Other long term (current) drug therapy: Secondary | ICD-10-CM | POA: Insufficient documentation

## 2013-05-10 DIAGNOSIS — J45909 Unspecified asthma, uncomplicated: Secondary | ICD-10-CM | POA: Insufficient documentation

## 2013-05-10 DIAGNOSIS — F3289 Other specified depressive episodes: Secondary | ICD-10-CM | POA: Insufficient documentation

## 2013-05-10 DIAGNOSIS — IMO0002 Reserved for concepts with insufficient information to code with codable children: Secondary | ICD-10-CM | POA: Insufficient documentation

## 2013-05-10 DIAGNOSIS — Z792 Long term (current) use of antibiotics: Secondary | ICD-10-CM | POA: Insufficient documentation

## 2013-05-10 DIAGNOSIS — F411 Generalized anxiety disorder: Secondary | ICD-10-CM | POA: Insufficient documentation

## 2013-05-10 DIAGNOSIS — L0291 Cutaneous abscess, unspecified: Secondary | ICD-10-CM

## 2013-05-10 MED ORDER — DOXYCYCLINE HYCLATE 100 MG PO TABS
100.0000 mg | ORAL_TABLET | Freq: Once | ORAL | Status: AC
  Administered 2013-05-10: 100 mg via ORAL
  Filled 2013-05-10: qty 1

## 2013-05-10 MED ORDER — IBUPROFEN 600 MG PO TABS: 600.0000 mg | ORAL_TABLET | Freq: Four times a day (QID) | ORAL | Status: DC | PRN

## 2013-05-10 MED ORDER — DOXYCYCLINE HYCLATE 100 MG PO CAPS: 100.0000 mg | ORAL_CAPSULE | Freq: Two times a day (BID) | ORAL | Status: DC

## 2013-05-10 MED ORDER — IBUPROFEN 200 MG PO TABS
600.0000 mg | ORAL_TABLET | Freq: Once | ORAL | Status: AC
  Administered 2013-05-10: 600 mg via ORAL
  Filled 2013-05-10: qty 3

## 2013-05-10 NOTE — ED Notes (Signed)
Boils under both armpits; draining; x 1-2 wks; painful

## 2013-05-10 NOTE — ED Provider Notes (Signed)
CSN: [id number]     Arrival date & time 05/10/13  2327 History  This chart was scribed for Earley Favor, NP working with Olivia Mackie, MD by Greggory Stallion, ED scribe. This patient was seen in room WTR9/WTR9 and the patient's care was started at 11:35 PM.   Chief Complaint  Patient presents with  . Abscess   The history is provided by the patient. No language interpreter was used.    HPI Comments: Brooke Castaneda is a 22 y.o. female who presents to the Emergency Department complaining of abscess under both axilla that started 2 weeks ago. She states they have been draining. Pt denies any other associated symptoms.   Past Medical History  Diagnosis Date  . ADHD (attention deficit hyperactivity disorder)   . Asthma   . Migraines     sees Dr. Asa Lente  . Anxiety     sees Dr. Evelene Croon   . Depression     sees Dr. Evelene Croon    Past Surgical History  Procedure Laterality Date  . Bladder surgery  2012  . Colonoscopy     Family History  Problem Relation Age of Onset  . Diabetes Maternal Grandmother    History  Substance Use Topics  . Smoking status: Current Some Day Smoker -- 1.00 packs/day for 4 years    Types: Cigarettes  . Smokeless tobacco: Never Used  . Alcohol Use: 0.0 oz/week     Comment: glass of wine every other day   OB History   Grav Para Term Preterm Abortions TAB SAB Ect Mult Living                 Review of Systems  Skin: Positive for wound.       Abscesses  All other systems reviewed and are negative.    Allergies  Toradol and Tramadol  Home Medications   Current Outpatient Rx  Name  Route  Sig  Dispense  Refill  . albuterol (PROVENTIL HFA;VENTOLIN HFA) 108 (90 BASE) MCG/ACT inhaler   Inhalation   Inhale 2 puffs into the lungs every 4 (four) hours as needed for wheezing.   1 Inhaler   0   . diazepam (VALIUM) 5 MG tablet   Oral   Take 5 mg by mouth every 6 (six) hours as needed. For anxiety.         Marland Kitchen doxycycline (VIBRAMYCIN) 100 MG capsule    Oral   Take 1 capsule (100 mg total) by mouth 2 (two) times daily.   20 capsule   0   . Hyoscyamine Sulfate 0.375 MG TBCR      Take one tablet twice a day for 5 days then as needed for abdominal pain   25 tablet   1   . ibuprofen (ADVIL,MOTRIN) 600 MG tablet   Oral   Take 1 tablet (600 mg total) by mouth every 6 (six) hours as needed for pain.   30 tablet   0   . Levonorgestrel (SKYLA) 13.5 MG IUD   Intrauterine   by Intrauterine route.         Marland Kitchen OLANZapine-FLUoxetine (SYMBYAX) 3-25 MG per capsule   Oral   Take 1 capsule by mouth every evening.          BP 113/69  Pulse 73  Temp(Src) 98.3 F (36.8 C) (Oral)  Resp 16  SpO2 99%  Physical Exam  Nursing note and vitals reviewed. Constitutional: She is oriented to person, place, and time. She appears well-developed  and well-nourished. No distress.  HENT:  Head: Normocephalic and atraumatic.  Right Ear: External ear normal.  Left Ear: External ear normal.  Nose: Nose normal.  Mouth/Throat: Oropharynx is clear and moist.  Eyes: Conjunctivae are normal.  Neck: Normal range of motion.  Cardiovascular: Normal rate, regular rhythm and normal heart sounds.   Pulmonary/Chest: Effort normal and breath sounds normal. No stridor. No respiratory distress. She has no wheezes. She has no rales.  Abdominal: Soft. She exhibits no distension.  Musculoskeletal: Normal range of motion.  Neurological: She is alert and oriented to person, place, and time. She has normal strength.  Skin: Skin is warm and dry. She is not diaphoretic. No erythema.  Drained moderate amount of purulent fluid from abscess on left axilla. Firm and slightly indurated with white center abscess on right axilla, not in need of incision and drainage at this time  Psychiatric: She has a normal mood and affect. Her behavior is normal.    ED Course   Procedures (including critical care time)  DIAGNOSTIC STUDIES: Oxygen Saturation is 99% on RA, normal by my  interpretation.    COORDINATION OF CARE: 11:39 PM-Discussed treatment plan which includes antibiotics and warm soaks with pt at bedside and pt agreed to plan.   Labs Reviewed - No data to display No results found. 1. Abscess     MDM  Abscess in L  Axilla  is draining  Abscess right axilla Encouraged warm soaks and PO antibiotics . At time of DC patient also states her stomach has been bothering her all day but will follow up with her GI specialist tomorrow       I personally performed the services described in this documentation, which was scribed in my presence. The recorded information has been reviewed and is accurate.  Arman Filter, NP 05/10/13 2352

## 2013-05-11 NOTE — ED Provider Notes (Signed)
Medical screening examination/treatment/procedure(s) were performed by non-physician practitioner and as supervising physician I was immediately available for consultation/collaboration.  Olivia Mackie, MD 05/11/13 [phone]

## 2013-05-25 ENCOUNTER — Encounter (HOSPITAL_COMMUNITY): Payer: Self-pay | Admitting: *Deleted

## 2013-05-25 ENCOUNTER — Inpatient Hospital Stay (HOSPITAL_COMMUNITY)
Admission: AD | Admit: 2013-05-25 | Discharge: 2013-05-25 | Disposition: A | Discharge status: Home / Self Care | Payer: 59 | Source: Ambulatory Visit | Attending: Obstetrics & Gynecology | Admitting: Obstetrics & Gynecology

## 2013-05-25 DIAGNOSIS — Z3202 Encounter for pregnancy test, result negative: Secondary | ICD-10-CM | POA: Insufficient documentation

## 2013-05-25 DIAGNOSIS — N949 Unspecified condition associated with female genital organs and menstrual cycle: Secondary | ICD-10-CM | POA: Insufficient documentation

## 2013-05-25 DIAGNOSIS — R102 Pelvic and perineal pain: Secondary | ICD-10-CM

## 2013-05-25 DIAGNOSIS — N898 Other specified noninflammatory disorders of vagina: Secondary | ICD-10-CM

## 2013-05-25 HISTORY — DX: Panic disorder (episodic paroxysmal anxiety): F41.0

## 2013-05-25 HISTORY — DX: Other specified behavioral and emotional disorders with onset usually occurring in childhood and adolescence: F98.8

## 2013-05-25 HISTORY — DX: Obsessive-compulsive disorder, unspecified: F42.9

## 2013-05-25 LAB — WET PREP, GENITAL
Trich, Wet Prep: NONE SEEN
Yeast Wet Prep HPF POC: NONE SEEN

## 2013-05-25 LAB — URINALYSIS, ROUTINE W REFLEX MICROSCOPIC
Bilirubin Urine: NEGATIVE
Glucose, UA: NEGATIVE mg/dL
Ketones, ur: NEGATIVE mg/dL
Protein, ur: NEGATIVE mg/dL
pH: 7 (ref 5.0–8.0)

## 2013-05-25 NOTE — MAU Provider Note (Signed)
History     CSN: [id number]  Arrival date and time: 05/25/13 1615   First Provider Initiated Contact with Patient 05/25/13 1806      Chief Complaint  Patient presents with  . Pelvic Pain  . Emesis  . possible pregnant    HPI Ms. Brooke Castaneda is a 22 y.o. G0P0000 who presents to MAU today with pelvic pain and vaginal discharge. The patient states that she has the Skyla IUD since 11/2012. She states that since menarche she has had "issues" with her pelvic and vaginal area. She is concerned that she might be pregnant. She has gained weight and having cramping and spotting last week. She rates her pain at 5/10 now. She has a history of recurrent BV in the past. She called the office about her concerns and they told her to come in, although she presented to MAU with concern for ectopic pregnancy.   OB History   Grav Para Term Preterm Abortions TAB SAB Ect Mult Living   0 0 0 0 0 0 0 0 0 0       Past Medical History  Diagnosis Date  . Asthma   . Migraines     sees Dr. Asa Lente  . Anxiety     sees Dr. Evelene Croon   . ADHD (attention deficit hyperactivity disorder)   . Depression     sees Dr. Evelene Croon   . Panic disorder   . OCD (obsessive compulsive disorder)   . ADD (attention deficit disorder)     Past Surgical History  Procedure Laterality Date  . Bladder surgery  2012  . Colonoscopy      Family History  Problem Relation Age of Onset  . Diabetes Maternal Grandmother   . Thyroid disease Mother     History  Substance Use Topics  . Smoking status: Current Some Day Smoker -- 1.00 packs/day for 4 years    Types: Cigarettes  . Smokeless tobacco: Never Used  . Alcohol Use: 0.0 oz/week     Comment: glass of wine every other day    Allergies:  Allergies  Allergen Reactions  . Flagyl (Metronidazole) Diarrhea  . Toradol (Ketorolac Tromethamine)     Tacycardia  . Tramadol Other (See Comments)    heart races    No prescriptions prior to admission    Review of  Systems  Constitutional: Negative for fever and malaise/fatigue.  Gastrointestinal: Positive for nausea, vomiting, abdominal pain and diarrhea. Negative for constipation.  Genitourinary: Negative for dysuria, urgency and frequency.       + vaginal discharge Neg - vaginal bleeding   Physical Exam   Blood pressure 101/59, pulse 53, temperature 97.7 F (36.5 C), temperature source Oral, resp. rate 16, height 5\' 8"  (1.727 m), weight 155 lb 9.6 oz (70.58 kg).  Physical Exam  Constitutional: She is oriented to person, place, and time. She appears well-developed and well-nourished. No distress.  HENT:  Head: Normocephalic and atraumatic.  Cardiovascular: Normal rate, regular rhythm and normal heart sounds.   Respiratory: Effort normal and breath sounds normal. No respiratory distress.  GI: Soft. Bowel sounds are normal. She exhibits no distension and no mass. There is tenderness (mild tenderness to palpation of the lower abdomen bialterally). There is no rebound and no guarding.  Genitourinary: Uterus is not enlarged and not tender. Cervix exhibits no motion tenderness, no discharge and no friability. Right adnexum displays no mass and no tenderness. Left adnexum displays no mass and no tenderness. No bleeding around the vagina.  Vaginal discharge (moderate amount of thick, white discharge noted) found.  Neurological: She is alert and oriented to person, place, and time.  Skin: Skin is warm and dry. No erythema.  Psychiatric: She has a normal mood and affect.   Results for orders placed during the hospital encounter of 05/25/13 (from the past 24 hour(s))  URINALYSIS, ROUTINE W REFLEX MICROSCOPIC     Status: Abnormal   Collection Time    05/25/13  4:58 PM      Result Value Range   Color, Urine STRAW (*) YELLOW   APPearance CLEAR  CLEAR   Specific Gravity, Urine <1.005 (*) 1.005 - 1.030   pH 7.0  5.0 - 8.0   Glucose, UA NEGATIVE  NEGATIVE mg/dL   Hgb urine dipstick TRACE (*) NEGATIVE    Bilirubin Urine NEGATIVE  NEGATIVE   Ketones, ur NEGATIVE  NEGATIVE mg/dL   Protein, ur NEGATIVE  NEGATIVE mg/dL   Urobilinogen, UA 0.2  0.0 - 1.0 mg/dL   Nitrite NEGATIVE  NEGATIVE   Leukocytes, UA NEGATIVE  NEGATIVE  URINE MICROSCOPIC-ADD ON     Status: None   Collection Time    05/25/13  4:58 PM      Result Value Range   Squamous Epithelial / LPF RARE  RARE  POCT PREGNANCY, URINE     Status: None   Collection Time    05/25/13  5:01 PM      Result Value Range   Preg Test, Ur NEGATIVE  NEGATIVE  WET PREP, GENITAL     Status: Abnormal   Collection Time    05/25/13  6:20 PM      Result Value Range   Yeast Wet Prep HPF POC NONE SEEN  NONE SEEN   Trich, Wet Prep NONE SEEN  NONE SEEN   Clue Cells Wet Prep HPF POC NONE SEEN  NONE SEEN   WBC, Wet Prep HPF POC FEW (*) NONE SEEN    MAU Course  Procedures None  MDM UPT - negative UA, wet prep, GC/Chlamydia  Assessment and Plan  A: Pelvic pain Vaginal discharge  P: Discharge home Patient advised to take ibuprofen as needed for pain  Patient encouraged to follow-up in the office as needed if symptoms persist or worsen Patient may return to MAU as needed or if her condition were to change or worsen  Freddi Starr, PA-C  05/25/2013, 7:48 PM

## 2013-05-25 NOTE — MAU Note (Signed)
Equivocal HPT today , Pelvic pain, vomiting x 2-3 weeks, has IUD since February.  No bleeding.

## 2013-05-26 ENCOUNTER — Telehealth: Payer: Self-pay | Admitting: Family Medicine

## 2013-05-26 LAB — GC/CHLAMYDIA PROBE AMP
CT Probe RNA: NEGATIVE
GC Probe RNA: NEGATIVE

## 2013-05-26 MED ORDER — AMPHETAMINE-DEXTROAMPHETAMINE 10 MG PO TABS: 10.0000 mg | ORAL_TABLET | Freq: Every day | ORAL | Status: DC | PRN

## 2013-05-26 NOTE — Telephone Encounter (Signed)
Script is ready for pick up and I spoke with pt.  

## 2013-05-26 NOTE — Telephone Encounter (Signed)
Refill once OK. 

## 2013-05-26 NOTE — Telephone Encounter (Signed)
PT is calling to request a refill of her amphetamine-dextroamphetamine (ADDERALL) 10 MG tablet. Please assist.

## 2013-05-26 NOTE — Telephone Encounter (Signed)
Can we write for a 30 day supply? Pt was last here on 02/17/13.

## 2013-06-04 ENCOUNTER — Telehealth: Payer: Self-pay | Admitting: Family Medicine
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
## 2013-06-05 NOTE — Telephone Encounter (Signed)
Pt wants to know if her mom could p/u rx and have her mom mail it to her

## 2013-06-05 NOTE — Telephone Encounter (Signed)
This can only be refilled with a written rx that she would personally have to pick up, so NO it cannot be refilled until she is back in town

## 2013-06-05 NOTE — Telephone Encounter (Signed)
Not without written permission from [place] for her to do this

## 2013-06-08 NOTE — Telephone Encounter (Signed)
PT called and stated that she will write, and mail her mother a letter today, so that she may pick up this RX.

## 2013-06-08 NOTE — Telephone Encounter (Signed)
We received written permission for her mother to pick up/ Pt request refill now. Pt is out and would like asap. amphetamine-dextroamphetamine (ADDERALL) 10 MG tablet 1 / day

## 2013-06-09 MED ORDER — AMPHETAMINE-DEXTROAMPHETAMINE 10 MG PO TABS: 10.0000 mg | ORAL_TABLET | Freq: Two times a day (BID) | ORAL | Status: DC

## 2013-06-09 NOTE — Telephone Encounter (Signed)
done

## 2013-06-09 NOTE — Telephone Encounter (Signed)
Script is ready for pick up and I left a voice message for pt. Also pt gave permission for Loma Messing Heuskin to pick up and this will be scanned into chart.

## 2013-07-10 ENCOUNTER — Telehealth: Payer: Self-pay | Admitting: Family Medicine

## 2013-07-10 MED ORDER — AMPHETAMINE-DEXTROAMPHETAMINE 10 MG PO TABS: 10.0000 mg | ORAL_TABLET | Freq: Two times a day (BID) | ORAL | Status: DC

## 2013-07-10 NOTE — Telephone Encounter (Signed)
done

## 2013-07-10 NOTE — Telephone Encounter (Signed)
Script is ready for pick up, tried to reach pt and no answer.  

## 2013-07-10 NOTE — Telephone Encounter (Signed)
Pt needs new rxs generic adderall 10 mg #60 for sept,oct and nov 2014. Pt will have mom pick up rxs.

## 2013-08-20 ENCOUNTER — Other Ambulatory Visit: Payer: Self-pay

## 2013-09-08 ENCOUNTER — Telehealth: Payer: Self-pay | Admitting: Family Medicine

## 2013-09-08 MED ORDER — AMPHETAMINE-DEXTROAMPHETAMINE 20 MG PO TABS: 20.0000 mg | ORAL_TABLET | Freq: Two times a day (BID) | ORAL | Status: DC

## 2013-09-08 NOTE — Telephone Encounter (Signed)
Done for 3 months but she needs to bring in all the unfilled rx for the 10 mg pills. Also she needs to sign a contract and be tested when she comes in

## 2013-09-08 NOTE — Telephone Encounter (Signed)
Patient Information:  Caller Name: Brooke Castaneda  Phone: [phone]  Patient: Brooke Castaneda  Gender: Female  DOB: September 10, 1991  Age: 22 Years  PCP: Brooke Castaneda [facility])  Pregnant: No  Office Follow Up:  Does the office need to follow up with this patient?: Yes  Instructions For The Office: request increase dosage and Rx for adderall  RN Note:  Noted that patient has not been seen since May, 2014. advised I would forward message to the office requesting and increase in Adderall. Please contact patient.  Symptoms  Reason For Call & Symptoms: Patients states she is currently on Adderall she is on 10mg  twice a day. She is wanting to up her dose to 20mg  twice daily. she reports an increase in her OCD and focusing.   She has increased it herself and it helped.  She lives two hours away in [place]. Brooke Castaneda and has not found a physician yet. .  She states her mother picks up her script and mails it to her.  Reviewed Health History In EMR: Yes  Reviewed Medications In EMR: Yes  Reviewed Allergies In EMR: Yes  Reviewed Surgeries / Procedures: Yes  Date of Onset of Symptoms: 09/08/2013 OB / GYN:  LMP: Unknown  Guideline(s) Used:  No Protocol Available - Information Only  Disposition Per Guideline:   Discuss with PCP and Callback by Nurse Today  Reason For Disposition Reached:   Nursing judgment  Advice Given:  Call Back If:  New symptoms develop  You become worse.  RN Overrode Recommendation:  Patient Requests Prescription  request increase dosage and Rx for adderall

## 2013-09-08 NOTE — Telephone Encounter (Signed)
Script is ready for pick up, contract printed and I spoke with pt. 

## 2013-09-30 ENCOUNTER — Telehealth: Payer: Self-pay | Admitting: Family Medicine

## 2013-09-30 NOTE — Telephone Encounter (Signed)
Brooke Castaneda/patient  [phone] called regarding RX issue.  Voice mail is not set up yet; RN unable to leave message,

## 2013-10-01 NOTE — Telephone Encounter (Signed)
Spoke to pt told her Dr. Clent Ridges can not refill Valium for her. Pt verbalized understanding.

## 2013-10-01 NOTE — Telephone Encounter (Signed)
Pt following up on call yesterday. Pt's psychiatrist did not have any appts for her until mid Jan , and she would like a refill on her valium.   Pt's psychiatrist will not fill for her until she is seen. Pt would like to see dr fry to get that rx, but there are no appts. pls advise.

## 2013-10-01 NOTE — Telephone Encounter (Signed)
NO I cannot refill this

## 2013-10-02 ENCOUNTER — Encounter: Payer: Self-pay | Admitting: Family Medicine

## 2013-12-04 ENCOUNTER — Telehealth: Payer: Self-pay | Admitting: Family Medicine

## 2013-12-04 MED ORDER — AMPHETAMINE-DEXTROAMPHETAMINE 20 MG PO TABS: 20.0000 mg | ORAL_TABLET | Freq: Two times a day (BID) | ORAL | Status: DC

## 2013-12-04 NOTE — Telephone Encounter (Signed)
Done for one month only. She needs testing and a contract  

## 2013-12-04 NOTE — Telephone Encounter (Signed)
Pt needs new rx generic adderall 20 mg °

## 2013-12-04 NOTE — Telephone Encounter (Signed)
Script is ready for pick up and I spoke with pt.  

## 2013-12-25 ENCOUNTER — Telehealth: Payer: Self-pay | Admitting: Family Medicine

## 2013-12-25 NOTE — Telephone Encounter (Signed)
Pt is needing new rx amphetamine-dextroamphetamine (ADDERALL) 20 MG tablet, please call when available for pick up. Pt states she has already did her urine test

## 2013-12-28 MED ORDER — AMPHETAMINE-DEXTROAMPHETAMINE 20 MG PO TABS: 20.0000 mg | ORAL_TABLET | Freq: Two times a day (BID) | ORAL | Status: DC

## 2013-12-28 NOTE — Telephone Encounter (Signed)
done

## 2013-12-28 NOTE — Telephone Encounter (Signed)
Script is ready for pick up and I left a voice message.  

## 2014-02-26 ENCOUNTER — Ambulatory Visit (INDEPENDENT_AMBULATORY_CARE_PROVIDER_SITE_OTHER): Payer: 59 | Admitting: Family Medicine

## 2014-02-26 ENCOUNTER — Encounter: Payer: Self-pay | Admitting: Family Medicine

## 2014-02-26 VITALS — BP 102/71 | HR 83 | Temp 98.6°F | Ht 68.0 in | Wt 158.0 lb

## 2014-02-26 DIAGNOSIS — J209 Acute bronchitis, unspecified: Secondary | ICD-10-CM

## 2014-02-26 DIAGNOSIS — R69 Illness, unspecified: Secondary | ICD-10-CM

## 2014-02-26 DIAGNOSIS — J111 Influenza due to unidentified influenza virus with other respiratory manifestations: Secondary | ICD-10-CM

## 2014-02-26 MED ORDER — AZITHROMYCIN 250 MG PO TABS: ORAL_TABLET | ORAL | Status: DC

## 2014-02-26 MED ORDER — HYDROCODONE-HOMATROPINE 5-1.5 MG/5ML PO SYRP: 5.0000 mL | ORAL_SOLUTION | ORAL | Status: DC | PRN

## 2014-02-26 MED ORDER — ALBUTEROL SULFATE HFA 108 (90 BASE) MCG/ACT IN AERS: 2.0000 | INHALATION_SPRAY | RESPIRATORY_TRACT | Status: DC | PRN

## 2014-02-26 NOTE — Progress Notes (Signed)
   Subjective:    Patient ID: Brooke MoralesGloria V Castaneda, female    DOB: 02-Oct-1991, 23 y.o.   MRN: [id number]  HPI Here for 5 days of chest tightness and coughing up green sputum. No fever.    Review of Systems  Constitutional: Negative.   HENT: Positive for congestion and postnasal drip.   Eyes: Negative.   Respiratory: Positive for cough.        Objective:   Physical Exam  Constitutional: She appears well-developed and well-nourished.  HENT:  Right Ear: External ear normal.  Left Ear: External ear normal.  Nose: Nose normal.  Mouth/Throat: Oropharynx is clear and moist.  Eyes: Conjunctivae are normal.  Pulmonary/Chest: Effort normal. No respiratory distress. She has no wheezes. She has no rales.  Scattered rhonchi   Lymphadenopathy:    She has no cervical adenopathy.          Assessment & Plan:  Add Mucinex

## 2014-02-26 NOTE — Progress Notes (Signed)
Pre visit review using our clinic review tool, if applicable. No additional management support is needed unless otherwise documented below in the visit note. 

## 2014-03-01 ENCOUNTER — Telehealth: Payer: Self-pay | Admitting: Family Medicine

## 2014-03-01 MED ORDER — HYDROCODONE-HOMATROPINE 5-1.5 MG/5ML PO SYRP: 5.0000 mL | ORAL_SOLUTION | ORAL | Status: DC | PRN

## 2014-03-01 NOTE — Telephone Encounter (Signed)
Pt was saw on 02-26-14 for bronchitis and still has cough. Pt is out of cough med and would like another round

## 2014-03-01 NOTE — Telephone Encounter (Signed)
done

## 2014-03-01 NOTE — Telephone Encounter (Signed)
Script is ready for pick up and I spoke with pt.  

## 2014-03-02 NOTE — Telephone Encounter (Signed)
Pt stated phar will not refill med due to its to soon. Pt is now requesting more abx and cough med call into [facility] on [address]

## 2014-03-02 NOTE — Telephone Encounter (Signed)
Call in Augmentin [phone] bid for 10 days. We cannot give her a new rx for cough med. She needs to get this from her original pharmacy whenever they decide they can fill it

## 2014-03-03 ENCOUNTER — Encounter: Payer: Self-pay | Admitting: Family Medicine

## 2014-03-04 ENCOUNTER — Telehealth: Payer: Self-pay | Admitting: Family Medicine

## 2014-03-04 NOTE — Telephone Encounter (Signed)
Dismissal Letter sent by Certified Mail 03/05/2014  Received the Return Receipt showing someone picked up the Dismissal 03/09/2014

## 2014-05-30 IMAGING — CT CT ABD-PELV W/ CM
1 of 2 series · 15 of 32 positions shown, 19 images · IV contrast (OMNIPAQUE 300)
Comparison: 04/08/2008

CLINICAL DATA: Lower abdominal pain, rectal bleeding

CT ABDOMEN AND PELVIS WITH CONTRAST
TECHNIQUE: Multidetector CT imaging of the abdomen and pelvis was
performed following the standard protocol during bolus
administration of intravenous contrast.
Contrast: 50mL OMNIPAQUE IOHEXOL 300 MG/ML  SOLN, 100mL OMNIPAQUE
IOHEXOL 300 MG/ML  SOLN

[Series 2: abd/pel with · axial · 0.83mm/px · z∈[+1296,+1726]mm · 15 of 94 slices shown, 19 images]
[im 4/94  soft-tissue]
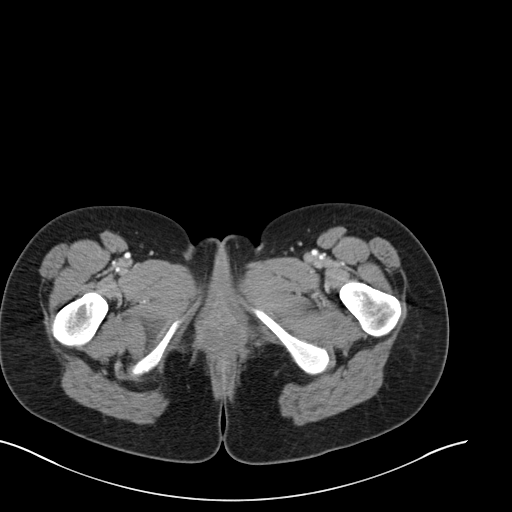
[im 4/94  bone]
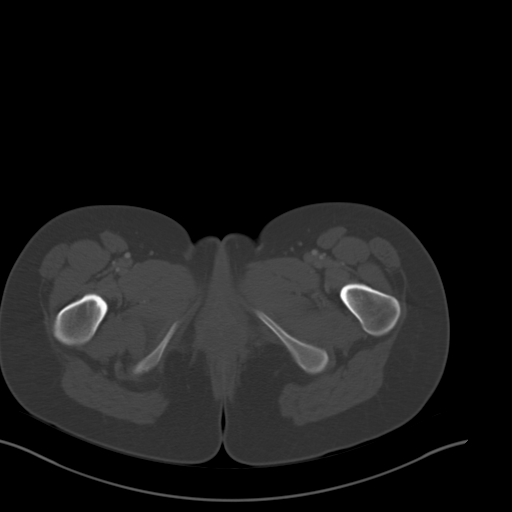
[im 12/94  soft-tissue]
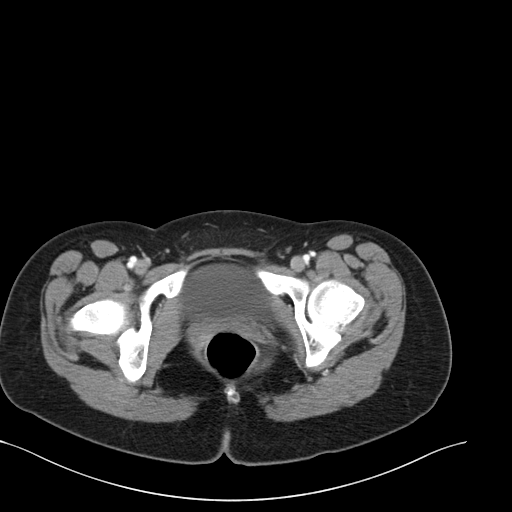
[im 20/94  soft-tissue]
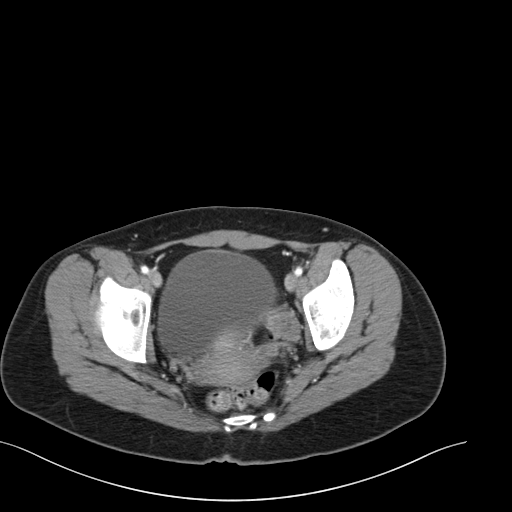
[im 28/94  soft-tissue]
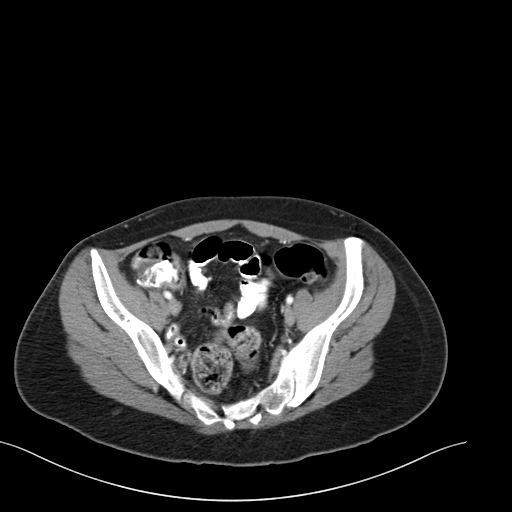
[im 32/94  soft-tissue]
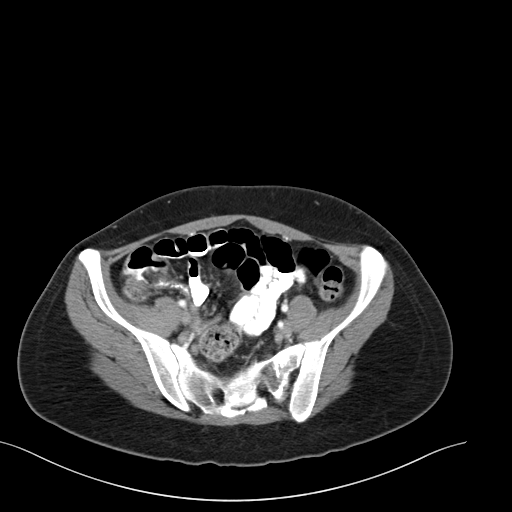
[im 39/94  soft-tissue]
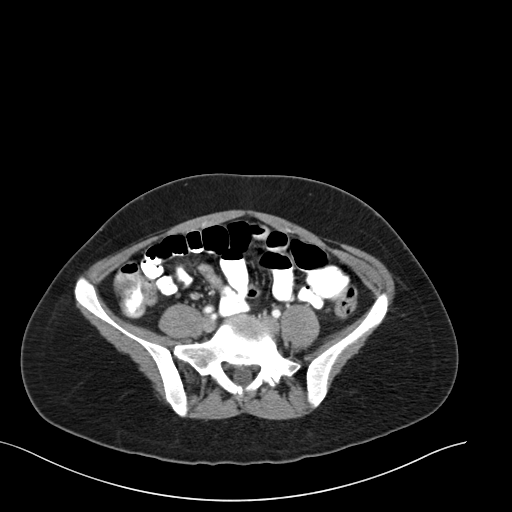
[im 47/94  soft-tissue]
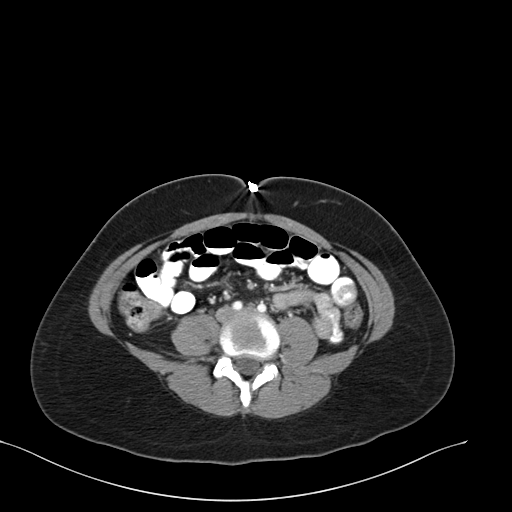
[im 55/94  soft-tissue]
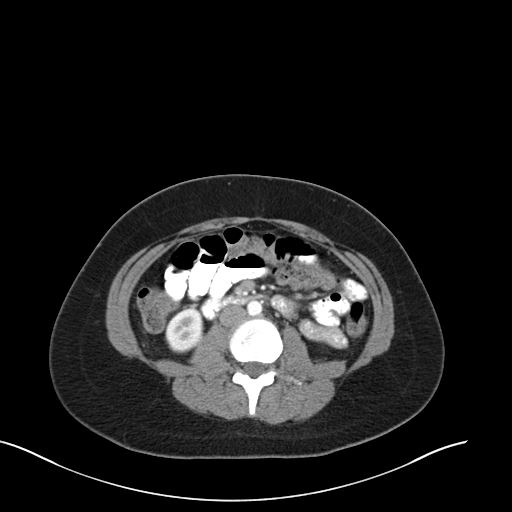
[im 63/94  soft-tissue]
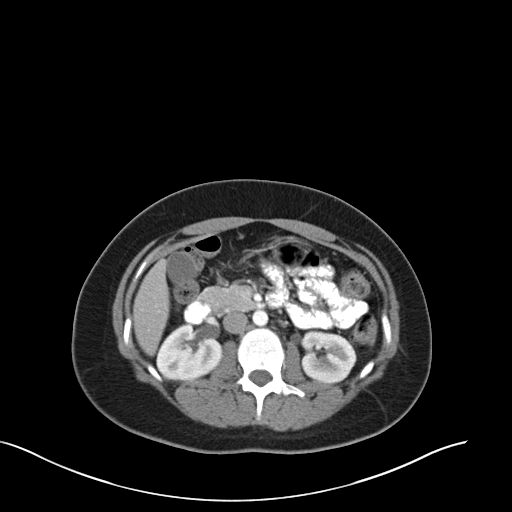
[im 63/94  bone]
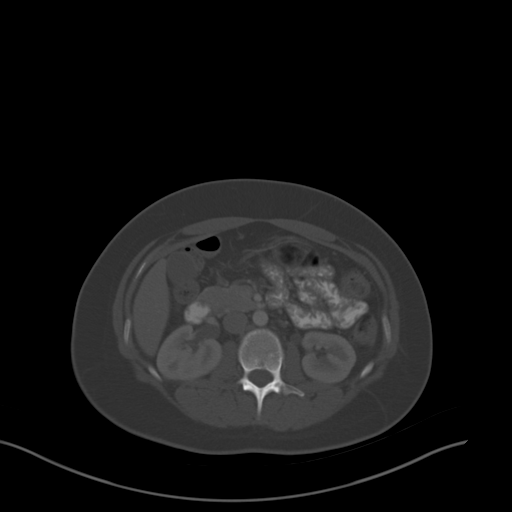
[im 66/94  soft-tissue]
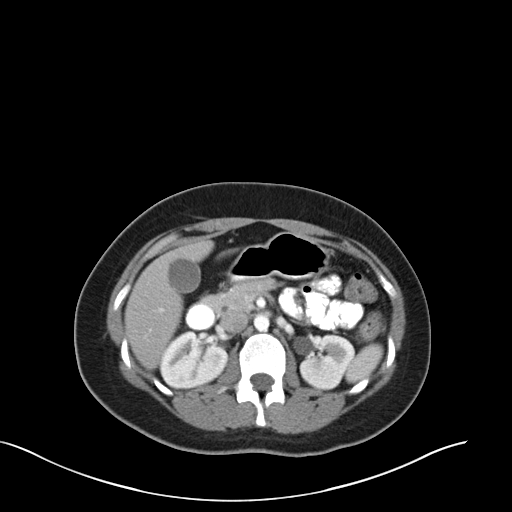
[im 74/94  soft-tissue]
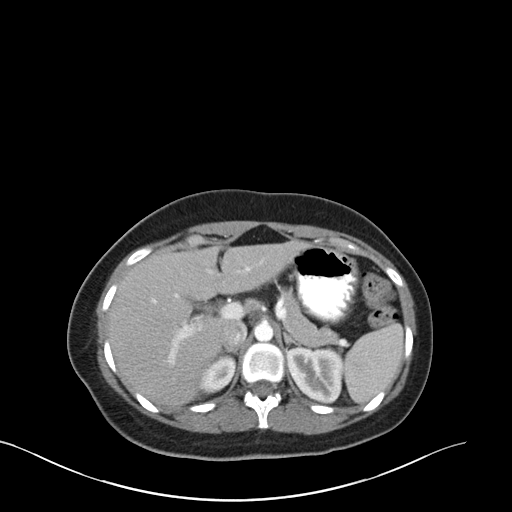
[im 78/94  lung]
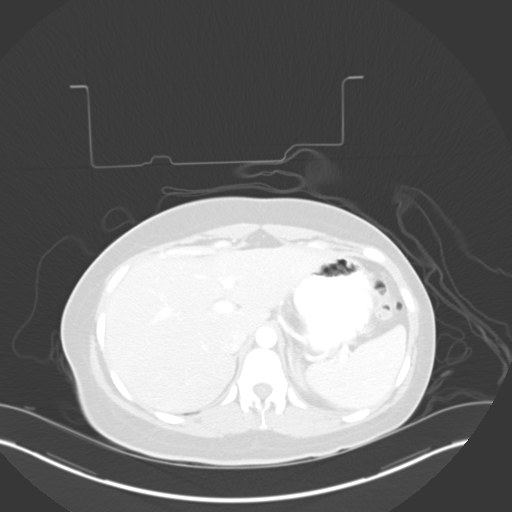
[im 82/94  soft-tissue]
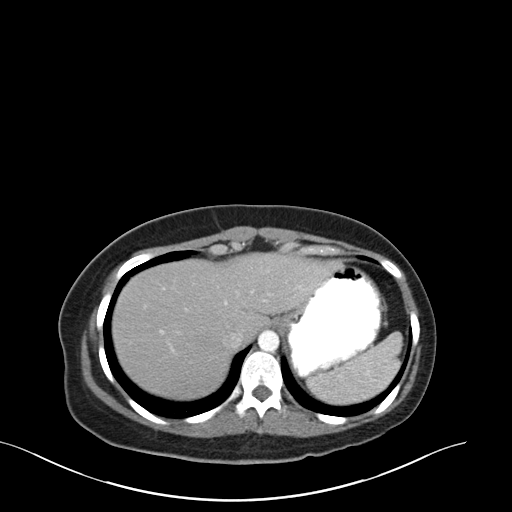
[im 82/94  lung]
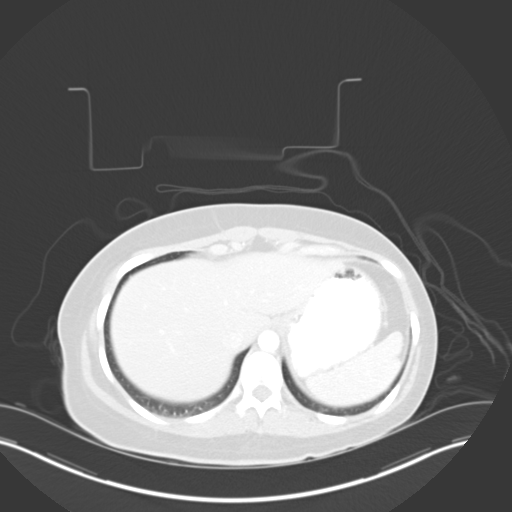
[im 86/94  lung]
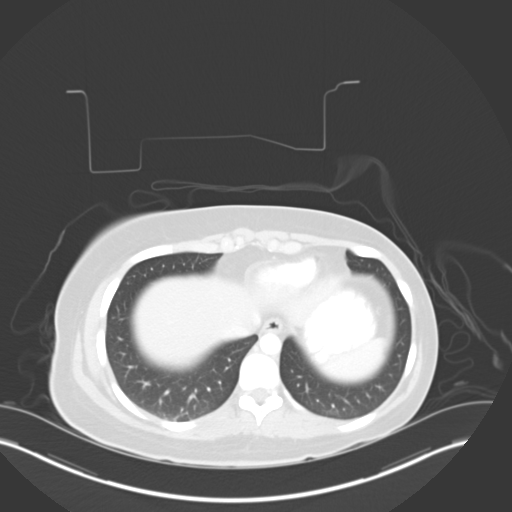
[im 90/94  soft-tissue]
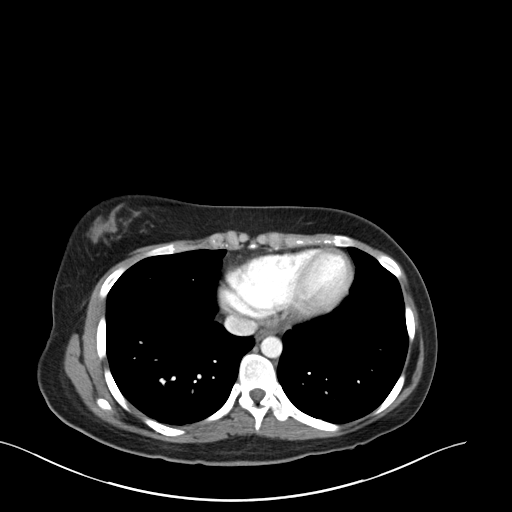
[im 90/94  lung]
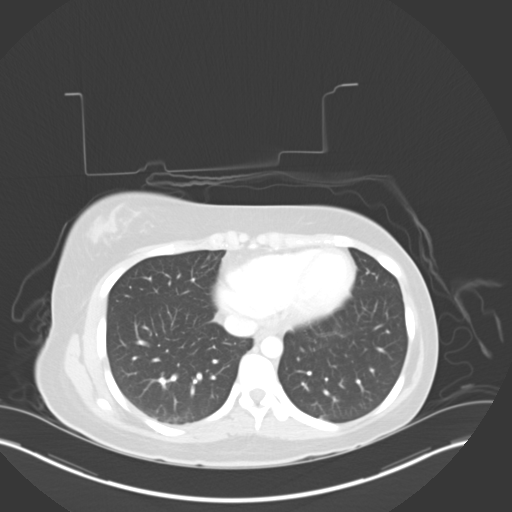

[15 of 32 positions shown; findings below may reference images not displayed]

FINDINGS: Sagittal images of the spine are unremarkable.  Lung
bases are unremarkable. Liver shows no biliary ductal dilatation.
No calcified gallstones are noted within gallbladder.  Pancreas,
spleen and adrenal glands are unremarkable.  Kidneys are
symmetrical in size and enhancement.  No hydronephrosis or
hydroureter.

No aortic aneurysm.  No small bowel obstruction.  No ascites or
free air.  No adenopathy.

There is no pericecal inflammation.  The terminal ileum is
unremarkable.  There is a low-lying cecum.  Normal appendix is
partially visualized in axial image 71 and coronal image 26.

The uterus and adnexa are unremarkable.  Mild distended urinary
bladder without bladder filling defects.  No destructive bony
lesions are noted within pelvis.  Moderate stool noted in sigmoid
colon.  Moderate gas noted within rectum.  No pelvic ascites or
adenopathy.  No inguinal adenopathy.

Mild lumbar levoscoliosis.
IMPRESSION: 1.  No hydronephrosis or hydroureter.
2.  No small bowel obstruction.
3.  No pericecal inflammation.  Normal appendix partially
visualized.
4.  Moderate stool noted in sigmoid colon.  Moderate gas noted
within rectum.

## 2014-08-22 ENCOUNTER — Encounter (HOSPITAL_COMMUNITY): Payer: Self-pay | Admitting: *Deleted

## 2014-08-22 ENCOUNTER — Inpatient Hospital Stay (HOSPITAL_COMMUNITY)
Admission: AD | Admit: 2014-08-22 | Discharge: 2014-08-22 | Disposition: A | Discharge status: Home / Self Care | Payer: 59 | Source: Ambulatory Visit | Attending: Family Medicine | Admitting: Family Medicine

## 2014-08-22 DIAGNOSIS — Z3201 Encounter for pregnancy test, result positive: Secondary | ICD-10-CM | POA: Diagnosis present

## 2014-08-22 DIAGNOSIS — Z3A01 Less than 8 weeks gestation of pregnancy: Secondary | ICD-10-CM | POA: Diagnosis not present

## 2014-08-22 DIAGNOSIS — F17210 Nicotine dependence, cigarettes, uncomplicated: Secondary | ICD-10-CM | POA: Diagnosis not present

## 2014-08-22 DIAGNOSIS — O2341 Unspecified infection of urinary tract in pregnancy, first trimester: Secondary | ICD-10-CM | POA: Diagnosis not present

## 2014-08-22 LAB — URINALYSIS, ROUTINE W REFLEX MICROSCOPIC
BILIRUBIN URINE: NEGATIVE
Glucose, UA: NEGATIVE mg/dL
HGB URINE DIPSTICK: NEGATIVE
KETONES UR: NEGATIVE mg/dL
NITRITE: POSITIVE — AB
PROTEIN: NEGATIVE mg/dL
Specific Gravity, Urine: 1.015 (ref 1.005–1.030)
UROBILINOGEN UA: 0.2 mg/dL (ref 0.0–1.0)
pH: 7 (ref 5.0–8.0)

## 2014-08-22 LAB — URINE MICROSCOPIC-ADD ON

## 2014-08-22 LAB — POCT PREGNANCY, URINE: Preg Test, Ur: POSITIVE — AB

## 2014-08-22 MED ORDER — CEPHALEXIN 500 MG PO CAPS: 500.0000 mg | ORAL_CAPSULE | Freq: Four times a day (QID) | ORAL | Status: DC

## 2014-08-22 NOTE — MAU Provider Note (Signed)
History     CSN: [id number]  Arrival date and time: 08/22/14 1423   First Provider Initiated Contact with Patient 08/22/14 1511     Confirmation of Pregnancy  HPI Brooke Castaneda 23 y.o. [id number]  Comes to MAU today for a pregnancy confirmation.  Has periodic cramping on the right side but is not having pain today.  Is not having any vaginal bleeding.  Sees    OB History    Gravida Para Term Preterm AB TAB SAB Ectopic Multiple Living   1 0 0 0 0 0 0 0 0 0       Past Medical History  Diagnosis Date  . Asthma   . Migraines     sees Dr. Asa LenteElliot Lewitt  . Anxiety     sees Dr. Evelene CroonKaur   . ADHD (attention deficit hyperactivity disorder)   . Depression     sees Dr. Evelene CroonKaur   . Panic disorder   . OCD (obsessive compulsive disorder)   . ADD (attention deficit disorder)     Past Surgical History  Procedure Laterality Date  . Bladder surgery  2012  . Colonoscopy      Family History  Problem Relation Age of Onset  . Diabetes Maternal Grandmother   . Thyroid disease Mother     History  Substance Use Topics  . Smoking status: Current Some Day Smoker -- 1.00 packs/day for 4 years    Types: Cigarettes  . Smokeless tobacco: Never Used     Comment: or less  . Alcohol Use: 0.0 oz/week     Comment: glass of wine every other day    Allergies:  Allergies  Allergen Reactions  . Flagyl [Metronidazole] Diarrhea  . Toradol [Ketorolac Tromethamine]     Tacycardia  . Tramadol Other (See Comments)    heart races    Prescriptions prior to admission  Medication Sig Dispense Refill Last Dose  . albuterol (PROVENTIL HFA;VENTOLIN HFA) 108 (90 BASE) MCG/ACT inhaler Inhale 2 puffs into the lungs every 4 (four) hours as needed for wheezing. 1 Inhaler 11   . amphetamine-dextroamphetamine (ADDERALL) 20 MG tablet Take 1 tablet (20 mg total) by mouth 2 (two) times daily. 60 tablet 0 Taking  . azithromycin (ZITHROMAX Z-PAK) 250 MG tablet As directed 6 each 0   . diazepam (VALIUM) 10 MG tablet  Take 10 mg by mouth every 6 (six) hours as needed for anxiety.   Taking  . HYDROcodone-homatropine (HYDROMET) 5-1.5 MG/5ML syrup Take 5 mLs by mouth every 4 (four) hours as needed for cough. 240 mL 0   . OLANZapine-FLUoxetine (SYMBYAX) 3-25 MG per capsule Take 1 capsule by mouth every evening.   Taking    Review of Systems  Constitutional: Positive for fever.  Gastrointestinal: Positive for vomiting, abdominal pain and constipation. Negative for nausea and diarrhea.       Has vomited once during the pregnancy  Genitourinary:       No vaginal discharge. No vaginal bleeding. No dysuria.   Physical Exam   Blood pressure 110/70, pulse 66, temperature 98 F (36.7 C), temperature source Oral, resp. rate 16, height 5\' 8"  (1.727 m), weight 173 lb 2 oz (78.529 kg), last menstrual period 07/19/2014.  Physical Exam  Nursing note and vitals reviewed. Constitutional: She is oriented to person, place, and time. She appears well-developed and well-nourished.  HENT:  Head: Normocephalic.  Eyes: EOM are normal.  Neck: Neck supple.  Respiratory: Effort normal.  GI:  No pain at present.  When pain  occurs it is in the low right quadrant near the groin.  Musculoskeletal: Normal range of motion.  Neurological: She is alert and oriented to person, place, and time.  Skin: Skin is warm and dry.  Psychiatric: She has a normal mood and affect.    MAU Course  Procedures  MDM Results for orders placed or performed during the hospital encounter of 08/22/14 (from the past 24 hour(s))  Urinalysis, Routine w reflex microscopic     Status: Abnormal   Collection Time: 08/22/14  2:43 PM  Result Value Ref Range   Color, Urine YELLOW YELLOW   APPearance HAZY (A) CLEAR   Specific Gravity, Urine 1.015 1.005 - 1.030   pH 7.0 5.0 - 8.0   Glucose, UA NEGATIVE NEGATIVE mg/dL   Hgb urine dipstick NEGATIVE NEGATIVE   Bilirubin Urine NEGATIVE NEGATIVE   Ketones, ur NEGATIVE NEGATIVE mg/dL   Protein, ur NEGATIVE  NEGATIVE mg/dL   Urobilinogen, UA 0.2 0.0 - 1.0 mg/dL   Nitrite POSITIVE (A) NEGATIVE   Leukocytes, UA SMALL (A) NEGATIVE  Urine microscopic-add on     Status: Abnormal   Collection Time: 08/22/14  2:43 PM  Result Value Ref Range   Squamous Epithelial / LPF FEW (A) RARE   WBC, UA 11-20 <3 WBC/hpf   RBC / HPF 0-2 <3 RBC/hpf   Bacteria, UA MANY (A) RARE  Pregnancy, urine POC     Status: Abnormal   Collection Time: 08/22/14  2:57 PM  Result Value Ref Range   Preg Test, Ur POSITIVE (A) NEGATIVE   Client wanted an ultrasound today but discussed that an ultrasound at 4 weeks is likely not to show the pregnancy and since she is not having pain at present, client decided to wait on the ultrasound for now.  Assessment and Plan  UTI in pregnancy  Plan Keflex 500 mg PO QID x 7 days.  (#28) no refills.  Pick up your medication from your pharmacy. Make an appointment with your psych provider to review your medications - has stopped her klonopin this week. Keep your appointment to begin prenatal care as scheduled later this month. Return if you have worsening abdominal pain or vaginal bleeding.  Dameka Younker 08/22/2014, 3:12 PM

## 2014-08-22 NOTE — MAU Note (Signed)
Pt states LMP-10/05, has had 2 positive pregnancy tests at home. Denies bleeding. Having cramping on her r side only that come and go.

## 2014-08-22 NOTE — Discharge Instructions (Signed)
Pick up your medication from your pharmacy. Make an appointment with your psych provider to review your medications - has stopped her klonopin this week. Keep your appointment to begin prenatal care as scheduled later this month. Return if you have worsening abdominal pain or vaginal bleeding.

## 2014-08-26 LAB — URINE CULTURE

## 2014-09-15 LAB — OB RESULTS CONSOLE RPR: RPR: NONREACTIVE

## 2014-09-15 LAB — OB RESULTS CONSOLE HEPATITIS B SURFACE ANTIGEN: Hepatitis B Surface Ag: NEGATIVE

## 2014-09-15 LAB — OB RESULTS CONSOLE ABO/RH: RH Type: POSITIVE

## 2014-09-15 LAB — OB RESULTS CONSOLE HIV ANTIBODY (ROUTINE TESTING): HIV: NONREACTIVE

## 2014-09-15 LAB — OB RESULTS CONSOLE GC/CHLAMYDIA
Chlamydia: NEGATIVE
Gonorrhea: NEGATIVE

## 2014-09-15 LAB — OB RESULTS CONSOLE RUBELLA ANTIBODY, IGM: Rubella: IMMUNE

## 2014-09-15 LAB — OB RESULTS CONSOLE ANTIBODY SCREEN: ANTIBODY SCREEN: NEGATIVE

## 2014-10-15 NOTE — L&D Delivery Note (Signed)
SVD of VMI at 1710 on 04/26/15.  EBL 300cc.  APGARs 9,9.  Placenta to L&D. Head deliverd ROA and body followed atraumatically.  Mouth and nose bulb suctioned.  Cord was clamped, cut and baby to abdomen.  Cord blood was obtained.  Placenta delivered S/I/3VC.  Fundus was firmed with pitocin and massage.  First degree perineal and bilateral periurethral lacs were repaired with 3-0 Rapide in the normal fashion.  Mom and baby stable.   Mitchel HonourMegan Joncarlo Friberg, DO

## 2015-02-14 ENCOUNTER — Encounter (HOSPITAL_COMMUNITY): Payer: Self-pay | Admitting: *Deleted

## 2015-02-14 ENCOUNTER — Inpatient Hospital Stay (HOSPITAL_COMMUNITY)
Admission: AD | Admit: 2015-02-14 | Discharge: 2015-02-14 | Disposition: A | Discharge status: Home / Self Care | Payer: 59 | Source: Ambulatory Visit | Attending: Obstetrics and Gynecology | Admitting: Obstetrics and Gynecology

## 2015-02-14 DIAGNOSIS — Z3A30 30 weeks gestation of pregnancy: Secondary | ICD-10-CM | POA: Diagnosis not present

## 2015-02-14 DIAGNOSIS — R51 Headache: Secondary | ICD-10-CM | POA: Diagnosis present

## 2015-02-14 DIAGNOSIS — Z87891 Personal history of nicotine dependence: Secondary | ICD-10-CM | POA: Insufficient documentation

## 2015-02-14 DIAGNOSIS — O9989 Other specified diseases and conditions complicating pregnancy, childbirth and the puerperium: Secondary | ICD-10-CM | POA: Insufficient documentation

## 2015-02-14 DIAGNOSIS — G43909 Migraine, unspecified, not intractable, without status migrainosus: Secondary | ICD-10-CM

## 2015-02-14 LAB — URINALYSIS, ROUTINE W REFLEX MICROSCOPIC
Bilirubin Urine: NEGATIVE
GLUCOSE, UA: NEGATIVE mg/dL
Ketones, ur: NEGATIVE mg/dL
Nitrite: NEGATIVE
PROTEIN: NEGATIVE mg/dL
SPECIFIC GRAVITY, URINE: 1.015 (ref 1.005–1.030)
Urobilinogen, UA: 0.2 mg/dL (ref 0.0–1.0)
pH: 7 (ref 5.0–8.0)

## 2015-02-14 LAB — URINE MICROSCOPIC-ADD ON

## 2015-02-14 MED ORDER — NALBUPHINE HCL 10 MG/ML IJ SOLN
10.0000 mg | Freq: Once | INTRAMUSCULAR | Status: AC
Start: 2015-02-14 — End: 2015-02-14
  Administered 2015-02-14: 10 mg via INTRAMUSCULAR
  Filled 2015-02-14: qty 1

## 2015-02-14 MED ORDER — OXYCODONE-ACETAMINOPHEN 5-325 MG PO TABS: 2.0000 | ORAL_TABLET | ORAL | Status: DC | PRN

## 2015-02-14 MED ORDER — PROMETHAZINE HCL 25 MG/ML IJ SOLN
12.5000 mg | Freq: Once | INTRAMUSCULAR | Status: AC
  Administered 2015-02-14: 12.5 mg via INTRAMUSCULAR
  Filled 2015-02-14: qty 1

## 2015-02-14 MED ORDER — ONDANSETRON 4 MG PO TBDP: 4.0000 mg | ORAL_TABLET | Freq: Three times a day (TID) | ORAL | Status: DC | PRN

## 2015-02-14 NOTE — Discharge Instructions (Signed)

## 2015-02-14 NOTE — MAU Provider Note (Signed)
History   G1 at 30 wks in with miagraine headache for aq week now. States tylenol does not help. Pt has long standing hx of miagraine headaches..  CSN: [id number]  Arrival date and time: 02/14/15 1253   First Provider Initiated Contact with Patient 02/14/15 1554      Chief Complaint  Patient presents with  . Headache  . Emesis  . Anxiety   HPI  OB History    Gravida Para Term Preterm AB TAB SAB Ectopic Multiple Living   1 0 0 0 0 0 0 0 0 0       Past Medical History  Diagnosis Date  . Asthma   . Migraines     sees Dr. Asa LenteElliot Lewitt  . Anxiety     sees Dr. Evelene CroonKaur   . ADHD (attention deficit hyperactivity disorder)   . Depression     sees Dr. Evelene CroonKaur   . Panic disorder   . OCD (obsessive compulsive disorder)   . ADD (attention deficit disorder)     Past Surgical History  Procedure Laterality Date  . Bladder surgery  2012  . Colonoscopy      Family History  Problem Relation Age of Onset  . Diabetes Maternal Grandmother   . Thyroid disease Mother     History  Substance Use Topics  . Smoking status: Former Smoker -- 1.00 packs/day for 4 years    Types: Cigarettes  . Smokeless tobacco: Never Used     Comment: or less  . Alcohol Use: No     Comment: glass of wine every other day    Allergies:  Allergies  Allergen Reactions  . Flagyl [Metronidazole] Diarrhea  . Toradol [Ketorolac Tromethamine] Other (See Comments)    Reaction:  Tachycardia  . Tramadol Other (See Comments)    Reaction:  Tachycardia    Prescriptions prior to admission  Medication Sig Dispense Refill Last Dose  . acetaminophen (TYLENOL) 500 MG tablet Take 1,500-2,000 mg by mouth every 6 (six) hours as needed for moderate pain.   Past Week at Unknown time  . calcium carbonate (TUMS - DOSED IN MG ELEMENTAL CALCIUM) 500 MG chewable tablet Chew 1-2 tablets by mouth 3 (three) times daily as needed for indigestion or heartburn.    02/14/2015 at Unknown time  . diphenhydrAMINE (BENADRYL) 25 MG tablet  Take 25-75 mg by mouth every 6 (six) hours as needed for allergies (or anxiety).   02/13/2015 at Unknown time  . FLUoxetine (PROZAC) 40 MG capsule Take 40 mg by mouth daily.   02/13/2015 at Unknown time  . Iron TABS Take 1 tablet by mouth daily.   02/13/2015 at Unknown time  . Prenatal Vit-Fe Fumarate-FA (PRENATAL MULTIVITAMIN) TABS tablet Take 1 tablet by mouth daily.   02/13/2015 at Unknown time  . tobramycin-dexamethasone (TOBRADEX) ophthalmic solution Place 1 drop into both eyes every 6 (six) hours.   02/13/2015 at Unknown time  . albuterol (PROVENTIL HFA;VENTOLIN HFA) 108 (90 BASE) MCG/ACT inhaler Inhale 2 puffs into the lungs every 4 (four) hours as needed for wheezing. 1 Inhaler 11 rescue  . cephALEXin (KEFLEX) 500 MG capsule Take 1 capsule (500 mg total) by mouth 4 (four) times daily. (Patient not taking: Reported on 02/14/2015) 28 capsule 0     Review of Systems  Constitutional: Negative.   Eyes: Negative.   Respiratory: Negative.   Cardiovascular: Negative.   Gastrointestinal: Negative.   Genitourinary: Negative.   Musculoskeletal: Negative.   Skin: Negative.   Neurological: Positive for headaches.  Endo/Heme/Allergies: Negative.   Psychiatric/Behavioral: Negative.    Physical Exam   Blood pressure 101/61, pulse 75, temperature 98.2 F (36.8 C), resp. rate 18, height  (1.651 m), weight 191 lb (86.637 kg), last menstrual period 07/19/2014.  Physical Exam  Constitutional: She is oriented to person, place, and time. She appears well-developed and well-nourished.  HENT:  Head: Normocephalic.  Neck: Normal range of motion.  Cardiovascular: Normal rate, regular rhythm, normal heart sounds and intact distal pulses.   Respiratory: Effort normal and breath sounds normal.  GI: Soft. Bowel sounds are normal.  Genitourinary: Vagina normal.  Musculoskeletal: Normal range of motion.  Neurological: She is alert and oriented to person, place, and time. She has normal reflexes.  Skin: Skin is  warm and dry.  Psychiatric: She has a normal mood and affect. Her behavior is normal. Thought content normal.    MAU Course  Procedures  MDM Migraine headache   Assessment and Plan  Stable maternal fetal unit. Migraine headache.  [name] 02/14/2015, 4:02 PM

## 2015-02-14 NOTE — MAU Note (Signed)
Pt presents to MAU with complaints of headache for over a month, vomiting and anxiety. Denies any vaginal bleeding or LOF

## 2015-03-30 ENCOUNTER — Telehealth: Payer: Self-pay | Admitting: Neurology

## 2015-03-30 ENCOUNTER — Ambulatory Visit: Payer: 59 | Admitting: Neurology

## 2015-03-30 NOTE — Telephone Encounter (Signed)
This patient did not show for a new patient appointment today. 

## 2015-04-04 LAB — OB RESULTS CONSOLE GBS: GBS: POSITIVE

## 2015-04-05 ENCOUNTER — Encounter: Payer: Self-pay | Admitting: Neurology

## 2015-04-22 ENCOUNTER — Encounter (HOSPITAL_COMMUNITY): Payer: Self-pay | Admitting: *Deleted

## 2015-04-22 ENCOUNTER — Telehealth (HOSPITAL_COMMUNITY): Payer: Self-pay | Admitting: *Deleted

## 2015-04-22 NOTE — Telephone Encounter (Signed)
Preadmission screen  

## 2015-04-24 ENCOUNTER — Inpatient Hospital Stay (HOSPITAL_COMMUNITY)
Admission: AD | Admit: 2015-04-24 | Discharge: 2015-04-25 | Disposition: A | Discharge status: Home / Self Care | Payer: 59 | Source: Ambulatory Visit | Attending: Obstetrics and Gynecology | Admitting: Obstetrics and Gynecology

## 2015-04-24 ENCOUNTER — Encounter (HOSPITAL_COMMUNITY): Payer: Self-pay | Admitting: *Deleted

## 2015-04-24 DIAGNOSIS — O99344 Other mental disorders complicating childbirth: Secondary | ICD-10-CM | POA: Diagnosis present

## 2015-04-24 DIAGNOSIS — J45909 Unspecified asthma, uncomplicated: Secondary | ICD-10-CM | POA: Diagnosis present

## 2015-04-24 DIAGNOSIS — F419 Anxiety disorder, unspecified: Secondary | ICD-10-CM | POA: Diagnosis present

## 2015-04-24 DIAGNOSIS — F909 Attention-deficit hyperactivity disorder, unspecified type: Secondary | ICD-10-CM | POA: Diagnosis present

## 2015-04-24 DIAGNOSIS — O9952 Diseases of the respiratory system complicating childbirth: Secondary | ICD-10-CM | POA: Diagnosis present

## 2015-04-24 DIAGNOSIS — O99354 Diseases of the nervous system complicating childbirth: Secondary | ICD-10-CM | POA: Diagnosis present

## 2015-04-24 DIAGNOSIS — O480 Post-term pregnancy: Principal | ICD-10-CM | POA: Diagnosis present

## 2015-04-24 DIAGNOSIS — O99824 Streptococcus B carrier state complicating childbirth: Secondary | ICD-10-CM | POA: Diagnosis present

## 2015-04-24 DIAGNOSIS — F410 Panic disorder [episodic paroxysmal anxiety] without agoraphobia: Secondary | ICD-10-CM | POA: Diagnosis present

## 2015-04-24 DIAGNOSIS — G43909 Migraine, unspecified, not intractable, without status migrainosus: Secondary | ICD-10-CM | POA: Diagnosis present

## 2015-04-24 DIAGNOSIS — F329 Major depressive disorder, single episode, unspecified: Secondary | ICD-10-CM | POA: Diagnosis present

## 2015-04-24 DIAGNOSIS — Z3A40 40 weeks gestation of pregnancy: Secondary | ICD-10-CM | POA: Diagnosis present

## 2015-04-24 DIAGNOSIS — F42 Obsessive-compulsive disorder: Secondary | ICD-10-CM | POA: Diagnosis present

## 2015-04-24 NOTE — MAU Note (Signed)
Pt reports contractions all day and pain is constant. Denies bleeding. Lower back pain.

## 2015-04-26 ENCOUNTER — Inpatient Hospital Stay (HOSPITAL_COMMUNITY)
Admission: RE | Admit: 2015-04-26 | Discharge: 2015-04-28 | DRG: 775 | Disposition: A | Discharge status: Home / Self Care | Payer: 59 | Source: Ambulatory Visit | Attending: Obstetrics & Gynecology | Admitting: Obstetrics & Gynecology

## 2015-04-26 ENCOUNTER — Encounter (HOSPITAL_COMMUNITY): Payer: Self-pay

## 2015-04-26 ENCOUNTER — Inpatient Hospital Stay (HOSPITAL_COMMUNITY): Payer: 59 | Admitting: Anesthesiology

## 2015-04-26 DIAGNOSIS — F42 Obsessive-compulsive disorder: Secondary | ICD-10-CM | POA: Diagnosis present

## 2015-04-26 DIAGNOSIS — O99354 Diseases of the nervous system complicating childbirth: Secondary | ICD-10-CM | POA: Diagnosis present

## 2015-04-26 DIAGNOSIS — F419 Anxiety disorder, unspecified: Secondary | ICD-10-CM | POA: Diagnosis present

## 2015-04-26 DIAGNOSIS — F329 Major depressive disorder, single episode, unspecified: Secondary | ICD-10-CM | POA: Diagnosis present

## 2015-04-26 DIAGNOSIS — O480 Post-term pregnancy: Secondary | ICD-10-CM | POA: Diagnosis present

## 2015-04-26 DIAGNOSIS — O99344 Other mental disorders complicating childbirth: Secondary | ICD-10-CM | POA: Diagnosis present

## 2015-04-26 DIAGNOSIS — J45909 Unspecified asthma, uncomplicated: Secondary | ICD-10-CM | POA: Diagnosis present

## 2015-04-26 DIAGNOSIS — Z3A40 40 weeks gestation of pregnancy: Secondary | ICD-10-CM | POA: Diagnosis present

## 2015-04-26 DIAGNOSIS — O99824 Streptococcus B carrier state complicating childbirth: Secondary | ICD-10-CM | POA: Diagnosis present

## 2015-04-26 DIAGNOSIS — Z3490 Encounter for supervision of normal pregnancy, unspecified, unspecified trimester: Secondary | ICD-10-CM

## 2015-04-26 DIAGNOSIS — G43909 Migraine, unspecified, not intractable, without status migrainosus: Secondary | ICD-10-CM | POA: Diagnosis present

## 2015-04-26 DIAGNOSIS — F410 Panic disorder [episodic paroxysmal anxiety] without agoraphobia: Secondary | ICD-10-CM | POA: Diagnosis present

## 2015-04-26 DIAGNOSIS — O9952 Diseases of the respiratory system complicating childbirth: Secondary | ICD-10-CM | POA: Diagnosis present

## 2015-04-26 DIAGNOSIS — F909 Attention-deficit hyperactivity disorder, unspecified type: Secondary | ICD-10-CM | POA: Diagnosis present

## 2015-04-26 LAB — CBC
HEMATOCRIT: 33.2 % — AB (ref 36.0–46.0)
HEMOGLOBIN: 10.9 g/dL — AB (ref 12.0–15.0)
MCH: 29.5 pg (ref 26.0–34.0)
MCHC: 32.8 g/dL (ref 30.0–36.0)
MCV: 89.7 fL (ref 78.0–100.0)
Platelets: 312 10*3/uL (ref 150–400)
RBC: 3.7 MIL/uL — ABNORMAL LOW (ref 3.87–5.11)
RDW: 14.9 % (ref 11.5–15.5)
WBC: 13 10*3/uL — ABNORMAL HIGH (ref 4.0–10.5)

## 2015-04-26 LAB — TYPE AND SCREEN
ABO/RH(D): O POS
Antibody Screen: NEGATIVE

## 2015-04-26 LAB — RPR: RPR Ser Ql: NONREACTIVE

## 2015-04-26 LAB — ABO/RH: ABO/RH(D): O POS

## 2015-04-26 MED ORDER — ACETAMINOPHEN 325 MG PO TABS: 650.0000 mg | ORAL_TABLET | ORAL | Status: DC | PRN

## 2015-04-26 MED ORDER — FENTANYL CITRATE (PF) 100 MCG/2ML IJ SOLN: 100.0000 ug | INTRAMUSCULAR | Status: DC | PRN

## 2015-04-26 MED ORDER — FLUOXETINE HCL 40 MG PO CAPS: 40.0000 mg | ORAL_CAPSULE | Freq: Every day | ORAL | Status: DC

## 2015-04-26 MED ORDER — CITRIC ACID-SODIUM CITRATE 334-500 MG/5ML PO SOLN: 30.0000 mL | ORAL | Status: DC | PRN

## 2015-04-26 MED ORDER — TETANUS-DIPHTH-ACELL PERTUSSIS 5-2.5-18.5 LF-MCG/0.5 IM SUSP: 0.5000 mL | Freq: Once | INTRAMUSCULAR | Status: DC

## 2015-04-26 MED ORDER — TERBUTALINE SULFATE 1 MG/ML IJ SOLN
0.2500 mg | Freq: Once | INTRAMUSCULAR | Status: DC | PRN
  Filled 2015-04-26: qty 1

## 2015-04-26 MED ORDER — SENNOSIDES-DOCUSATE SODIUM 8.6-50 MG PO TABS
2.0000 | ORAL_TABLET | ORAL | Status: DC
  Administered 2015-04-27 – 2015-04-28 (×2): 2 via ORAL
  Filled 2015-04-26 (×2): qty 2

## 2015-04-26 MED ORDER — DIBUCAINE 1 % RE OINT: 1.0000 "application " | TOPICAL_OINTMENT | RECTAL | Status: DC | PRN

## 2015-04-26 MED ORDER — FENTANYL CITRATE (PF) 100 MCG/2ML IJ SOLN
INTRAMUSCULAR | Status: AC
  Filled 2015-04-26: qty 2

## 2015-04-26 MED ORDER — WITCH HAZEL-GLYCERIN EX PADS: 1.0000 "application " | MEDICATED_PAD | CUTANEOUS | Status: DC | PRN

## 2015-04-26 MED ORDER — PHENYLEPHRINE 40 MCG/ML (10ML) SYRINGE FOR IV PUSH (FOR BLOOD PRESSURE SUPPORT)
80.0000 ug | PREFILLED_SYRINGE | INTRAVENOUS | Status: DC | PRN
  Filled 2015-04-26: qty 20
  Filled 2015-04-26: qty 2

## 2015-04-26 MED ORDER — EPHEDRINE 5 MG/ML INJ
10.0000 mg | INTRAVENOUS | Status: DC | PRN
  Filled 2015-04-26: qty 2

## 2015-04-26 MED ORDER — LANOLIN HYDROUS EX OINT: TOPICAL_OINTMENT | CUTANEOUS | Status: DC | PRN

## 2015-04-26 MED ORDER — ONDANSETRON HCL 4 MG/2ML IJ SOLN
4.0000 mg | Freq: Four times a day (QID) | INTRAMUSCULAR | Status: DC | PRN
  Administered 2015-04-26: 4 mg via INTRAVENOUS
  Filled 2015-04-26: qty 2

## 2015-04-26 MED ORDER — LACTATED RINGERS IV SOLN
INTRAVENOUS | Status: DC
  Administered 2015-04-26 (×2): via INTRAVENOUS

## 2015-04-26 MED ORDER — IBUPROFEN 600 MG PO TABS
600.0000 mg | ORAL_TABLET | Freq: Four times a day (QID) | ORAL | Status: DC
  Administered 2015-04-26 – 2015-04-28 (×6): 600 mg via ORAL
  Filled 2015-04-26 (×5): qty 1

## 2015-04-26 MED ORDER — FENTANYL 2.5 MCG/ML BUPIVACAINE 1/10 % EPIDURAL INFUSION (WH - ANES): 14.0000 mL/h | INTRAMUSCULAR | Status: DC | PRN

## 2015-04-26 MED ORDER — DIPHENHYDRAMINE HCL 25 MG PO CAPS
25.0000 mg | ORAL_CAPSULE | Freq: Four times a day (QID) | ORAL | Status: DC | PRN
  Administered 2015-04-28: 25 mg via ORAL
  Filled 2015-04-26: qty 1

## 2015-04-26 MED ORDER — ALBUTEROL SULFATE (2.5 MG/3ML) 0.083% IN NEBU: 3.0000 mL | INHALATION_SOLUTION | RESPIRATORY_TRACT | Status: DC | PRN

## 2015-04-26 MED ORDER — FLUOXETINE HCL 20 MG PO CAPS
40.0000 mg | ORAL_CAPSULE | Freq: Every day | ORAL | Status: DC
  Administered 2015-04-26 – 2015-04-27 (×2): 40 mg via ORAL
  Filled 2015-04-26 (×3): qty 2

## 2015-04-26 MED ORDER — ZOLPIDEM TARTRATE 5 MG PO TABS: 5.0000 mg | ORAL_TABLET | Freq: Every evening | ORAL | Status: DC | PRN

## 2015-04-26 MED ORDER — LIDOCAINE HCL (PF) 1 % IJ SOLN
INTRAMUSCULAR | Status: DC | PRN
  Administered 2015-04-26: 3 mL
  Administered 2015-04-26: 7 mL

## 2015-04-26 MED ORDER — OXYTOCIN 40 UNITS IN LACTATED RINGERS INFUSION - SIMPLE MED
1.0000 m[IU]/min | INTRAVENOUS | Status: DC
  Administered 2015-04-26: 2 m[IU]/min via INTRAVENOUS
  Filled 2015-04-26: qty 1000

## 2015-04-26 MED ORDER — PRENATAL MULTIVITAMIN CH
1.0000 | ORAL_TABLET | Freq: Every day | ORAL | Status: DC
  Administered 2015-04-27: 1 via ORAL
  Filled 2015-04-26: qty 1

## 2015-04-26 MED ORDER — OXYCODONE-ACETAMINOPHEN 5-325 MG PO TABS: 2.0000 | ORAL_TABLET | ORAL | Status: DC | PRN

## 2015-04-26 MED ORDER — FENTANYL 2.5 MCG/ML BUPIVACAINE 1/10 % EPIDURAL INFUSION (WH - ANES)
14.0000 mL/h | INTRAMUSCULAR | Status: DC | PRN
  Administered 2015-04-26 (×2): 14 mL/h via EPIDURAL
  Filled 2015-04-26 (×2): qty 125

## 2015-04-26 MED ORDER — FLEET ENEMA 7-19 GM/118ML RE ENEM: 1.0000 | ENEMA | RECTAL | Status: DC | PRN

## 2015-04-26 MED ORDER — ONDANSETRON HCL 4 MG PO TABS
4.0000 mg | ORAL_TABLET | ORAL | Status: DC | PRN
Start: 2015-04-26 — End: 2015-04-28

## 2015-04-26 MED ORDER — FENTANYL 2.5 MCG/ML BUPIVACAINE 1/10 % EPIDURAL INFUSION (WH - ANES)
INTRAMUSCULAR | Status: DC | PRN
  Administered 2015-04-26: 14 mL/h via EPIDURAL

## 2015-04-26 MED ORDER — PENICILLIN G POTASSIUM 5000000 UNITS IJ SOLR
2.5000 10*6.[IU] | INTRAVENOUS | Status: DC
  Administered 2015-04-26 (×2): 2.5 10*6.[IU] via INTRAVENOUS
  Filled 2015-04-26 (×5): qty 2.5

## 2015-04-26 MED ORDER — ONDANSETRON HCL 4 MG/2ML IJ SOLN: 4.0000 mg | INTRAMUSCULAR | Status: DC | PRN

## 2015-04-26 MED ORDER — OXYCODONE-ACETAMINOPHEN 5-325 MG PO TABS
1.0000 | ORAL_TABLET | ORAL | Status: DC | PRN
  Administered 2015-04-27 (×3): 1 via ORAL
  Filled 2015-04-26 (×3): qty 1

## 2015-04-26 MED ORDER — OXYTOCIN 40 UNITS IN LACTATED RINGERS INFUSION - SIMPLE MED: 62.5000 mL/h | INTRAVENOUS | Status: DC

## 2015-04-26 MED ORDER — OXYTOCIN BOLUS FROM INFUSION
500.0000 mL | INTRAVENOUS | Status: DC
  Administered 2015-04-26: 500 mL via INTRAVENOUS

## 2015-04-26 MED ORDER — LIDOCAINE HCL (PF) 1 % IJ SOLN
30.0000 mL | INTRAMUSCULAR | Status: DC | PRN
  Administered 2015-04-26: 30 mL via SUBCUTANEOUS
  Filled 2015-04-26: qty 30

## 2015-04-26 MED ORDER — LACTATED RINGERS IV SOLN: 500.0000 mL | INTRAVENOUS | Status: DC | PRN

## 2015-04-26 MED ORDER — DIPHENHYDRAMINE HCL 50 MG/ML IJ SOLN: 12.5000 mg | INTRAMUSCULAR | Status: DC | PRN

## 2015-04-26 MED ORDER — PENICILLIN G POTASSIUM 5000000 UNITS IJ SOLR
5.0000 10*6.[IU] | Freq: Once | INTRAVENOUS | Status: AC
  Administered 2015-04-26: 5 10*6.[IU] via INTRAVENOUS
  Filled 2015-04-26: qty 5

## 2015-04-26 MED ORDER — BENZOCAINE-MENTHOL 20-0.5 % EX AERO: 1.0000 "application " | INHALATION_SPRAY | CUTANEOUS | Status: DC | PRN

## 2015-04-26 MED ORDER — SIMETHICONE 80 MG PO CHEW
80.0000 mg | CHEWABLE_TABLET | ORAL | Status: DC | PRN
  Administered 2015-04-27 (×2): 80 mg via ORAL
  Filled 2015-04-26 (×2): qty 1

## 2015-04-26 NOTE — Anesthesia Procedure Notes (Signed)
Epidural Patient location during procedure: OB Start time: 04/26/2015 9:17 AM End time: 04/26/2015 9:30 AM  Staffing Anesthesiologist: Sebastian AcheMANNY, Presley Gora  Preanesthetic Checklist Completed: patient identified, site marked, surgical consent, pre-op evaluation, timeout performed, IV checked, risks and benefits discussed and monitors and equipment checked  Epidural Patient position: sitting Prep: site prepped and draped and DuraPrep Patient monitoring: heart rate, continuous pulse ox and blood pressure Approach: midline Location: L2-L3 Injection technique: LOR air  Needle:  Needle type: Tuohy  Needle gauge: 17 G Needle length: 9 cm and 9 Needle insertion depth: 6.5 cm Catheter type: closed end flexible Catheter size: 19 Gauge Catheter at skin depth: 14 cm Test dose: negative  Assessment Events: blood not aspirated, injection not painful, no injection resistance, negative IV test and no paresthesia  Additional Notes   Patient tolerated the insertion well without complications.Reason for block:procedure for pain

## 2015-04-26 NOTE — Anesthesia Preprocedure Evaluation (Addendum)
Anesthesia Evaluation  Patient identified by MRN, date of birth, ID band Patient awake and Patient confused    Reviewed: Allergy & Precautions, H&P , NPO status , Patient's Chart, lab work & pertinent test results  Airway Mallampati: II       Dental   Pulmonary asthma (mild and in past) , former smoker,  breath sounds clear to auscultation  Pulmonary exam normal       Cardiovascular Exercise Tolerance: Good Normal cardiovascular examRhythm:regular Rate:Normal     Neuro/Psych  Headaches, Anxiety Depression    GI/Hepatic negative GI ROS, Neg liver ROS,   Endo/Other  negative endocrine ROS  Renal/GU negative Renal ROS     Musculoskeletal   Abdominal   Peds  Hematology   Anesthesia Other Findings   Reproductive/Obstetrics (+) Pregnancy                            Anesthesia Physical Anesthesia Plan  ASA: II  Anesthesia Plan: Epidural   Post-op Pain Management:    Induction:   Airway Management Planned:   Additional Equipment:   Intra-op Plan:   Post-operative Plan:   Informed Consent:   Plan Discussed with:   Anesthesia Plan Comments:         Anesthesia Quick Evaluation

## 2015-04-26 NOTE — H&P (Signed)
Brooke Castaneda is a 24 y.o. female presenting for post-dates IOL.  Antepartum course complicated by Asthma which has been stable.  She also has anxiety and depression on Prozac and followed by Dr. Evelene CroonKaur.  GBS postiive. Patient was 2-3 cm in office last week.    Maternal Medical History:  Fetal activity: Perceived fetal activity is normal.   Last perceived fetal movement was within the past hour.    Prenatal complications: no prenatal complications Prenatal Complications - Diabetes: none.    OB History    Gravida Para Term Preterm AB TAB SAB Ectopic Multiple Living   1 0 0 0 0 0 0 0 0 0      Past Medical History  Diagnosis Date  . Asthma   . Migraines     sees Dr. Asa LenteElliot Lewitt  . Anxiety     sees Dr. Evelene CroonKaur   . ADHD (attention deficit hyperactivity disorder)   . Depression     sees Dr. Evelene CroonKaur   . Panic disorder   . OCD (obsessive compulsive disorder)   . ADD (attention deficit disorder)   . Vaginal Pap smear, abnormal   . Hx of varicella    Past Surgical History  Procedure Laterality Date  . Bladder surgery  2012  . Colonoscopy     Family History: family history includes Diabetes in her maternal grandmother; Thyroid disease in her mother. Social History:  reports that she has quit smoking. She has never used smokeless tobacco. She reports that she does not drink alcohol or use illicit drugs.   Prenatal Transfer Tool  Maternal Diabetes: No Genetic Screening: Normal Maternal Ultrasounds/Referrals: Normal Fetal Ultrasounds or other Referrals:  None Maternal Substance Abuse:  No Significant Maternal Medications:  Meds include: Prozac Significant Maternal Lab Results:  Lab values include: Group B Strep positive Other Comments:  None  ROS    Blood pressure 118/70, pulse 85, temperature 97.5 F (36.4 C), temperature source Oral, resp. rate 18, height 5\' 8"  (1.727 m), weight 205 lb (92.987 kg), last menstrual period 07/19/2014. Maternal Exam:  Uterine Assessment:  Contraction strength is mild.  Contraction frequency is rare.   Abdomen: Patient reports no abdominal tenderness. Fundal height is c/w dates .   Estimated fetal weight is 7#8.    Introitus: Normal vulva. Pelvis: adequate for delivery.   Cervix: Cervix evaluated by digital exam.     Physical Exam  Constitutional: She is oriented to person, place, and time. She appears well-developed and well-nourished.  GI: Soft. There is no rebound and no guarding.  Neurological: She is alert and oriented to person, place, and time.  Skin: Skin is warm and dry.  Psychiatric: She has a normal mood and affect. Her behavior is normal.    Prenatal labs: ABO, Rh: O/Positive/-- (12/02 0000) Antibody: Negative (12/02 0000) Rubella: Immune (12/02 0000) RPR: Nonreactive (12/02 0000)  HBsAg: Negative (12/02 0000)  HIV: Non-reactive (12/02 0000)  GBS: Positive (06/20 0000)   Assessment/Plan: 23[facility] -Patient does not tolerate exam for AROM.  Will get epidural and start pitocin.  AROM when able. -GBS pos-PCN -Anticipate NSVD   Saheed Carrington 04/26/2015, 8:46 AM

## 2015-04-27 LAB — CBC
HCT: 27.5 % — ABNORMAL LOW (ref 36.0–46.0)
HEMOGLOBIN: 9.1 g/dL — AB (ref 12.0–15.0)
MCH: 29.6 pg (ref 26.0–34.0)
MCHC: 33.1 g/dL (ref 30.0–36.0)
MCV: 89.6 fL (ref 78.0–100.0)
PLATELETS: 239 10*3/uL (ref 150–400)
RBC: 3.07 MIL/uL — ABNORMAL LOW (ref 3.87–5.11)
RDW: 15.1 % (ref 11.5–15.5)
WBC: 13.4 10*3/uL — AB (ref 4.0–10.5)

## 2015-04-27 NOTE — Anesthesia Postprocedure Evaluation (Signed)
  Anesthesia Post-op Note  Patient: Raynelle FanningGloria V Heuskin  Procedure(s) Performed: * No procedures listed *  Patient Location: Mother/Baby  Anesthesia Type:Epidural  Level of Consciousness: awake, alert , oriented and patient cooperative  Airway and Oxygen Therapy: Patient Spontanous Breathing  Post-op Pain: none  Post-op Assessment: Post-op Vital signs reviewed, Patient's Cardiovascular Status Stable, Respiratory Function Stable, Patent Airway, No headache, No backache and Patient able to bend at knees              Post-op Vital Signs: Reviewed and stable  Last Vitals:  Filed Vitals:   04/27/15 0628  BP: 114/65  Pulse: 78  Temp: 36.7 C  Resp: 18    Complications: No apparent anesthesia complications

## 2015-04-27 NOTE — Progress Notes (Signed)
CLINICAL SOCIAL WORK MATERNAL/CHILD NOTE  Patient Details  Name: Brooke Castaneda MRN: [id number] Date of Birth: 04/26/2015  Date:  04/27/2015  Clinical Social Worker Initiating Note:  Clara Smolen, LCSW Date/ Time Initiated:  04/27/15/1000     Child's Name:  Brooke Castaneda   Legal Guardian:  Brandon Musleh (father) and [name] (mother)  Need for Interpreter:  None   Date of Referral:  04/26/15     Reason for Referral:  History of anxiety, depression, panic attacks  Referral Source:  Central Nursery   Address:  [address] [place], [place] [address]  Phone number:  [phone]   Household Members:  FOB, FOB's parents   Natural Supports (not living in the home):  Friends   Professional Supports: Dr. Kaur, psychiatrist  Employment: Homemaker   Type of Work:   MOB stated that the FOB is employed, and she discussed intention to stay at home with the infant.   Education:  High school graduate   Financial Resources:  Private Insurance   Other Resources:    None identified  Cultural/Religious Considerations Which May Impact Care:  None reported  Strengths:  Ability to meet basic needs , Home prepared for child    Risk Factors/Current Problems:   1)Mental Health Concerns: MOB presents with history of anxiety since age 24. MOB endorsed significant anxiety during the pregnancy which resulted in her having chest pain, panic attacks, and no desire to leave the home.  MOB is currently prescribed Prozac, but stated that her medications needed to be changed with the pregnancy. She stated that she intends to follow up postpartum with her psychiatrist in order to re-start previous medications.   Cognitive State:  Able to Concentrate , Alert , Goal Oriented , Insightful , Linear Thinking    Mood/Affect:  Animated, Calm , Comfortable , Happy , Interested    CSW Assessment:  CSW received request for consult due to MOB presenting with a history of depression,  anxiety, and panic attacks.  MOB and FOB presented as easily engaged and receptive to the visit. FOB left during the middle of the assessment in order to complete the birth certificate, but he participated until his departure. MOB displayed a full range in affect and was in a pleasant mood.  MOB did not present with any acute mental health symptoms, and discussed motivation and intention to address mental health needs postpartum.  CSW provided support as MOB reflected upon her thoughts and feelings as she transitions to the postpartum period.  MOB discussed feeling well supported and satisfied with her labor and delivery of the infant. She recognized that she had anxiety prior to her epidural, but shared that she realized that it was better than she had anticipated.  MOB discussed that since the infant has been born, she has not noted any increase in anxiety. She reported that it continues to feel surreal that the infant has been born and that she created the infant, but she reported excited "happy", "excited", and looking forward to the transition home.  MOB shared that the FOB has anxiety related to the infant since he is concerned if the infant is breathing, if the infant is eating enough, and if his behaviors are normative newborn behaviors.  MOB discussed that she is used to his anxiety, and reported belief that he will cope better once he becomes more comfortable as a parent.  Per MOB, she lives with the FOB and his parents. She identified them as "very supportive". MOB   stated that the FOB's father is home during the day, and she finds this helpful since she knows that she will not be alone during the day with the infant. MOB continued to express gratitude for the FOB and his parents as they have learned how to support her anxiety during the pregnancy and ensured that she feels that she is never alone.   Per MOB, she has a history of anxiety since age 24.  MOB reported that she has been receiving  medication management services from Dr. Evelene CroonKaur, and stated that she felt that symptoms were well controlled with previous medications. She shared that her medications needed to be changed with the +UPT, and she reported taking Prozac during the pregnancy.  MOB reflected upon the pregnancy, and FOB confirmed that the MOB's anxiety was poorly controlled. MOB discussed frequently not wanting to leave her room, low motivation to leave the home, chest pain, and constantly "worrying".  MOB stated that she was not aware of any triggers for her increase in anxiety which made it more difficult since she did not feel like she could prepare or avoid triggers during the pregnancy.  For these reasons, MOB voiced intention to contact her Dr. Evelene CroonKaur to discuss re-starting her medications postpartum.  MOB acknowledges that she has an increased risk for symptoms continuing postpartum due to prior mental health history and her symptoms during the pregnancy.   CSW continued to explore maternal strengths and coping skills that assisted her to cope with anxiety during the pregnancy. MOB reflected upon her ability to engage in self-talk in order to remind herself that the pregnancy is short term, and once the pregnancy is complete, she will be able to re-start her medications. She also stated that she utilized the support of the FOB and his parents, including creating a dialogue with them about her needs and how to best support her when she has anxiety.  MOB recognizes how these coping skills and strategies can continue to be utilized and transferred postpartum.  She continued to express belief that she will be "okay" since she has the support from her family.  MOB acknowledges the importance of identifying what is "going well" even when she feels that all is wrong.  She stated that she intends to remain positive even when she notes increase in anxiety.    MOB denied additional questions, concerns, or needs at this time. She expressed  appreciation for the visit, and agreed to contact CSW if additional needs arise during the admission.   CSW Plan/Description:   1)Patient/Family Education: Perinatal mood and anxiety disorders 2) MOB reported intention to contact her psychiatrist, Dr. Evelene CroonKaur, in order to schedule follow up visit in order to re-start previous medications for anxiety since MOB believes that her anxiety was better controlled pre-pregnancy when she was on different medications.  3)No Further Intervention Required/No Barriers to Discharge    Kelby FamVenning, Hayven Fatima N, LCSW 04/27/2015, 12:20 PM

## 2015-04-27 NOTE — Progress Notes (Signed)
Dr Henderson Cloudomblin notified of pt's pain and pain meds  No orders at this time

## 2015-04-27 NOTE — Progress Notes (Signed)
Pt c/o of severe R sided lower abd pain.  An additional percocet given with a mylicon.  Pt reevaluated after 20 mins and stated pain not much better.  No rebound tenderness.  Dr Langston MaskerMorris notified

## 2015-04-27 NOTE — Progress Notes (Signed)
Post Partum Day 1 Subjective: no complaints, up ad lib and voiding  Objective: Blood pressure 114/65, pulse 78, temperature 98 F (36.7 C), temperature source Oral, resp. rate 18, height 5\' 8"  (1.727 m), weight 205 lb (92.987 kg), last menstrual period 07/19/2014, SpO2 100 %, unknown if currently breastfeeding.  Physical Exam:  General: alert, cooperative and no distress Lochia: appropriate Uterine Fundus: firm Incision: healing well DVT Evaluation: No evidence of DVT seen on physical exam.   Recent Labs  04/26/15 0810 04/27/15 0615  HGB 10.9* 9.1*  HCT 33.2* 27.5*    Assessment/Plan: Plan for discharge tomorrow   LOS: 1 day   Brooke Castaneda,Brooke Castaneda 04/27/2015, 8:13 AM

## 2015-04-28 MED ORDER — OXYCODONE-ACETAMINOPHEN 5-325 MG PO TABS: 1.0000 | ORAL_TABLET | ORAL | Status: DC | PRN

## 2015-04-28 MED ORDER — IBUPROFEN 600 MG PO TABS
600.0000 mg | ORAL_TABLET | Freq: Four times a day (QID) | ORAL | Status: DC
Start: 2015-04-28 — End: 2017-09-01

## 2015-04-28 NOTE — Discharge Summary (Signed)
Obstetric Discharge Summary Reason for Admission: induction of labor Prenatal Procedures: none Intrapartum Procedures: spontaneous vaginal delivery Postpartum Procedures: none Complications-Operative and Postpartum: 1 degree perineal laceration HEMOGLOBIN  Date Value Ref Range Status  04/27/2015 9.1* 12.0 - 15.0 g/dL Final   HCT  Date Value Ref Range Status  04/27/2015 27.5* 36.0 - 46.0 % Final    Physical Exam:  General: alert and cooperative Lochia: appropriate Uterine Fundus: firm Incision: n/a DVT Evaluation: No evidence of DVT seen on physical exam.  Discharge Diagnoses: Term Pregnancy-delivered  Discharge Information: Date: 04/28/2015 Activity: pelvic rest Diet: routine Medications: PNV, Ibuprofen and Percocet Condition: stable Instructions: refer to practice specific booklet Discharge to: home Follow-up Information    Schedule an appointment as soon as possible for a visit in 6 weeks to follow up.      Newborn Data: Live born female  Birth Weight: 7 lb 7.2 oz (3380 g) APGAR: 9, 9  Home with mother.  Brooke Castaneda 04/28/2015, 5:56 PM

## 2016-04-16 ENCOUNTER — Encounter (HOSPITAL_COMMUNITY): Payer: Self-pay

## 2016-04-16 ENCOUNTER — Emergency Department (HOSPITAL_COMMUNITY)
Admission: EM | Admit: 2016-04-16 | Discharge: 2016-04-17 | Disposition: A | Discharge status: Home / Self Care | Payer: Medicaid Other | Attending: Emergency Medicine | Admitting: Emergency Medicine

## 2016-04-16 DIAGNOSIS — J45909 Unspecified asthma, uncomplicated: Secondary | ICD-10-CM | POA: Diagnosis not present

## 2016-04-16 DIAGNOSIS — Z79899 Other long term (current) drug therapy: Secondary | ICD-10-CM | POA: Diagnosis not present

## 2016-04-16 DIAGNOSIS — Z87891 Personal history of nicotine dependence: Secondary | ICD-10-CM | POA: Diagnosis not present

## 2016-04-16 DIAGNOSIS — F419 Anxiety disorder, unspecified: Secondary | ICD-10-CM | POA: Diagnosis present

## 2016-04-16 DIAGNOSIS — F410 Panic disorder [episodic paroxysmal anxiety] without agoraphobia: Secondary | ICD-10-CM | POA: Diagnosis not present

## 2016-04-16 DIAGNOSIS — F064 Anxiety disorder due to known physiological condition: Secondary | ICD-10-CM

## 2016-04-16 NOTE — ED Provider Notes (Signed)
CSN: [id number]     Arrival date & time 04/16/16  2138 History   First MD Initiated Contact with Patient 04/16/16 2345     Chief Complaint  Patient presents with  . Anxiety  . Generalized Body Aches     (Consider location/radiation/quality/duration/timing/severity/associated sxs/prior Treatment) HPI   25 year old female with history of panic disorder, anxiety, ADHD, migraine, asthma, OCD presenting today with complaining of anxiety. Patient states she has recurrent bouts of anxiety in which she takes Klonopin 2 mg 3 times daily as needed. Today her anxiety has been persistent throughout the day since the moment she woke up. She endorse having heart palpitation, feeling like she's going to faint, feeling anxious, sweaty, along with body aches and chills. She took the Klonopin once earlier today but provides no relief. She denies having severe headache, runny nose, sneezing, coughing, sore throat, shortness of breath, productive cough, abdominal pain, back pain, dysuria, hematuria, vaginal bleeding or vaginal discharge. She denies any precipitating factor. Patient states she does have recurrent bouts of anxiety and panic attack and this felt similar except longer duration. Mom encouraged her to go to the ER for further evaluation since her mom has history of thyroid disease and would like her daughter to be checked. Patient did call or return to her primary care office but it was closed today. Patient denies any other recent changes. She was pregnant last year but currently not pregnant and not breast-feeding. She denies any change in her medication. She has history of asthma and did use her inhaler once today.  Past Medical History  Diagnosis Date  . Asthma   . Migraines     sees Dr. Asa LenteElliot Lewitt  . Anxiety     sees Dr. Evelene CroonKaur   . ADHD (attention deficit hyperactivity disorder)   . Depression     sees Dr. Evelene CroonKaur   . Panic disorder   . OCD (obsessive compulsive disorder)   . ADD (attention  deficit disorder)   . Vaginal Pap smear, abnormal   . Hx of varicella    Past Surgical History  Procedure Laterality Date  . Bladder surgery  2012  . Colonoscopy     Family History  Problem Relation Age of Onset  . Diabetes Maternal Grandmother   . Thyroid disease Mother    Social History  Substance Use Topics  . Smoking status: Former Games developermoker  . Smokeless tobacco: Never Used     Comment: or less  . Alcohol Use: No     Comment: glass of wine every other day   OB History    Gravida Para Term Preterm AB TAB SAB Ectopic Multiple Living   1 1 1  0 0 0 0 0 0 1     Review of Systems  All other systems reviewed and are negative.     Allergies  Flagyl; Toradol; and Tramadol  Home Medications   Prior to Admission medications   Medication Sig Start Date End Date Taking? Authorizing Provider  albuterol (PROVENTIL HFA;VENTOLIN HFA) 108 (90 BASE) MCG/ACT inhaler Inhale 2 puffs into the lungs every 4 (four) hours as needed for wheezing. 02/26/14   Nelwyn SalisburyStephen A Fry, MD  calcium carbonate (TUMS - DOSED IN MG ELEMENTAL CALCIUM) 500 MG chewable tablet Chew 1-2 tablets by mouth 3 (three) times daily as needed for indigestion or heartburn.     Historical Provider, MD  FLUoxetine (PROZAC) 40 MG capsule Take 40 mg by mouth daily.    Historical Provider, MD  ibuprofen (ADVIL,MOTRIN) 600  MG tablet Take 1 tablet (600 mg total) by mouth every 6 (six) hours. 04/28/15   Zelphia CairoGretchen Adkins, MD  oxyCODONE-acetaminophen (PERCOCET/ROXICET) 5-325 MG per tablet Take 2 tablets by mouth every 4 (four) hours as needed for severe pain. 02/14/15   Montez MoritaMarie D Lawson, CNM  oxyCODONE-acetaminophen (PERCOCET/ROXICET) 5-325 MG per tablet Take 1 tablet by mouth every 4 (four) hours as needed (for pain scale 4-7). 04/28/15   Zelphia CairoGretchen Adkins, MD  Prenatal Vit-Fe Fumarate-FA (PRENATAL MULTIVITAMIN) TABS tablet Take 1 tablet by mouth daily.    Historical Provider, MD  ranitidine (ZANTAC) 75 MG tablet Take 75 mg by mouth 2 (two) times  daily.    Historical Provider, MD   BP 116/67 mmHg  Pulse 95  Temp(Src) 98.9 F (37.2 C) (Oral)  Resp 16  Ht 5\' 8"  (1.727 m)  Wt 77.111 kg  BMI 25.85 kg/m2  SpO2 100% Physical Exam  Constitutional: She is oriented to person, place, and time. She appears well-developed and well-nourished. No distress.  Caucasian female, in no acute distress but with heavy perspiring (chronic in nature)  HENT:  Head: Atraumatic.  Right Ear: External ear normal.  Left Ear: External ear normal.  Nose: Nose normal.  Mouth/Throat: Oropharynx is clear and moist.  Eyes: Conjunctivae and EOM are normal. Pupils are equal, round, and reactive to light.  Neck: Normal range of motion. Neck supple. No thyromegaly present.  No nuchal rigidity  Cardiovascular: Normal rate and regular rhythm.  Exam reveals no gallop and no friction rub.   No murmur heard. Pulmonary/Chest: Effort normal and breath sounds normal. No respiratory distress. She has no wheezes. She has no rales. She exhibits no tenderness.  Abdominal: Soft. Bowel sounds are normal. She exhibits no distension. There is no tenderness.  Neurological: She is alert and oriented to person, place, and time. She has normal strength. No cranial nerve deficit or sensory deficit. GCS eye subscore is 4. GCS verbal subscore is 5. GCS motor subscore is 6.  Skin: No rash noted.  Psychiatric: She has a normal mood and affect.  Nursing note and vitals reviewed.   ED Course  Procedures (including critical care time)   MDM   Final diagnoses:  Anxiety disorder due to general medical condition with panic attack    BP 116/67 mmHg  Pulse 95  Temp(Src) 98.9 F (37.2 C) (Oral)  Resp 16  Ht 5\' 8"  (1.727 m)  Wt 77.111 kg  BMI 25.85 kg/m2  SpO2 100%   12:06 AM Patient here with anxiety and panic attack that has been lasting for all day. History of same. Suspect this is acute on chronic. She is afebrile with stable normal vital sign. I agree that patient may  benefit from further evaluation for thyroid disease in which she can follow-up with her PCP for further care. Otherwise I felt that his symptoms can be treated with Klonopin at home, as well as Vistaril to use as needed. She is not homicidal or suicidal. No recent changes in medication recreational drug use. She agrees to follow-up with her PCP for further care. Strict return precaution discussed. Patient is stable for discharge at this time. Doubt PE or other acute medical condition warranting further urgent evaluation at this time.    Fayrene HelperBowie Artin Mceuen, PA-C 04/17/16 0018  Loren Raceravid Yelverton, MD 04/20/16 [phone]

## 2016-04-16 NOTE — ED Notes (Signed)
Pt states anxiety attack today, unresolved with medicaiton. Pt states new onset body aches and chills today. Pt denies any nausea, vomiting or diarrhea. Pt requesting to have thyroid checked.

## 2016-04-17 MED ORDER — HYDROXYZINE HCL 25 MG PO TABS: 25.0000 mg | ORAL_TABLET | Freq: Three times a day (TID) | ORAL | Status: DC | PRN

## 2016-04-17 MED ORDER — HYDROXYZINE HCL 10 MG PO TABS
10.0000 mg | ORAL_TABLET | Freq: Once | ORAL | Status: AC
  Administered 2016-04-17: 10 mg via ORAL
  Filled 2016-04-17: qty 1

## 2016-04-17 NOTE — Discharge Instructions (Signed)
Please follow up with your primary care provider for further evaluation of your anxiety and panic attack.  It may be prudent to have your thyroid level check as thyroid disease may cause some of the similar symptoms that you are experiencing.  Take Klonopin as needed for anxiety.  You may also alternate it with hydroxyzine for anxiety.   Panic Attacks Panic attacks are sudden, short feelings of great fear or discomfort. You may have them for no reason when you are relaxed, when you are uneasy (anxious), or when you are sleeping.  HOME CARE  Take all your medicines as told.  Check with your doctor before starting new medicines.  Keep all doctor visits. GET HELP IF:  You are not able to take your medicines as told.  Your symptoms do not get better.  Your symptoms get worse. GET HELP RIGHT AWAY IF:  Your attacks seem different than your normal attacks.  You have thoughts about hurting yourself or others.  You take panic attack medicine and you have a side effect. MAKE SURE YOU:  Understand these instructions.  Will watch your condition.  Will get help right away if you are not doing well or get worse.   This information is not intended to replace advice given to you by your health care provider. Make sure you discuss any questions you have with your health care provider.   Document Released: 11/03/2010 Document Revised: 07/22/2013 Document Reviewed: 05/15/2013 Elsevier Interactive Patient Education [employer]2016 [employer]

## 2016-05-24 ENCOUNTER — Ambulatory Visit (INDEPENDENT_AMBULATORY_CARE_PROVIDER_SITE_OTHER): Payer: Medicaid Other | Admitting: Family Medicine

## 2016-05-24 ENCOUNTER — Encounter: Payer: Self-pay | Admitting: Family Medicine

## 2016-05-24 ENCOUNTER — Other Ambulatory Visit (HOSPITAL_COMMUNITY)
Admission: RE | Admit: 2016-05-24 | Discharge: 2016-05-24 | Disposition: A | Discharge status: Home / Self Care | Payer: Medicaid Other | Source: Ambulatory Visit | Attending: Family Medicine | Admitting: Family Medicine

## 2016-05-24 VITALS — BP 104/67 | HR 99 | Temp 98.2°F | Resp 16 | Ht 68.0 in | Wt 163.0 lb

## 2016-05-24 DIAGNOSIS — Z7189 Other specified counseling: Secondary | ICD-10-CM

## 2016-05-24 DIAGNOSIS — Z0000 Encounter for general adult medical examination without abnormal findings: Secondary | ICD-10-CM | POA: Diagnosis not present

## 2016-05-24 DIAGNOSIS — N760 Acute vaginitis: Secondary | ICD-10-CM | POA: Diagnosis present

## 2016-05-24 DIAGNOSIS — D649 Anemia, unspecified: Secondary | ICD-10-CM

## 2016-05-24 DIAGNOSIS — R102 Pelvic and perineal pain: Secondary | ICD-10-CM | POA: Diagnosis not present

## 2016-05-24 DIAGNOSIS — Z113 Encounter for screening for infections with a predominantly sexual mode of transmission: Secondary | ICD-10-CM | POA: Insufficient documentation

## 2016-05-24 DIAGNOSIS — Z7689 Persons encountering health services in other specified circumstances: Secondary | ICD-10-CM

## 2016-05-24 LAB — CBC WITH DIFFERENTIAL/PLATELET
BASOS ABS: 0 {cells}/uL (ref 0–200)
BASOS PCT: 0 %
EOS PCT: 2 %
Eosinophils Absolute: 116 cells/uL (ref 15–500)
HCT: 45.6 % — ABNORMAL HIGH (ref 35.0–45.0)
Hemoglobin: 14.9 g/dL (ref 11.7–15.5)
LYMPHS ABS: 1450 {cells}/uL (ref 850–3900)
Lymphocytes Relative: 25 %
MCH: 29.3 pg (ref 27.0–33.0)
MCHC: 32.7 g/dL (ref 32.0–36.0)
MCV: 89.8 fL (ref 80.0–100.0)
MPV: 10.1 fL (ref 7.5–12.5)
Monocytes Absolute: 290 cells/uL (ref 200–950)
Monocytes Relative: 5 %
Neutro Abs: 3944 cells/uL (ref 1500–7800)
Neutrophils Relative %: 68 %
Platelets: 245 10*3/uL (ref 140–400)
RBC: 5.08 MIL/uL (ref 3.80–5.10)
RDW: 14.4 % (ref 11.0–15.0)
WBC: 5.8 10*3/uL (ref 3.8–10.8)

## 2016-05-24 LAB — COMPLETE METABOLIC PANEL WITH GFR
ALK PHOS: 80 U/L (ref 33–115)
ALT: 9 U/L (ref 6–29)
AST: 14 U/L (ref 10–30)
Albumin: 3.9 g/dL (ref 3.6–5.1)
BUN: 9 mg/dL (ref 7–25)
CO2: 26 mmol/L (ref 20–31)
Calcium: 8.9 mg/dL (ref 8.6–10.2)
Chloride: 105 mmol/L (ref 98–110)
Creat: 0.78 mg/dL (ref 0.50–1.10)
GFR, Est African American: >89 mL/min (ref >=60)
GLUCOSE: 102 mg/dL — AB (ref 65–99)
Potassium: 4.2 mmol/L (ref 3.5–5.3)
SODIUM: 138 mmol/L (ref 135–146)
Total Bilirubin: 0.3 mg/dL (ref 0.2–1.2)
Total Protein: 6.7 g/dL (ref 6.1–8.1)

## 2016-05-24 LAB — POCT URINALYSIS DIP (DEVICE)
Bilirubin Urine: NEGATIVE
GLUCOSE, UA: NEGATIVE mg/dL
KETONES UR: NEGATIVE mg/dL
Nitrite: NEGATIVE
Protein, ur: 30 mg/dL — AB
Specific Gravity, Urine: 1.02 (ref 1.005–1.030)
Urobilinogen, UA: 1 mg/dL (ref 0.0–1.0)
pH: 7 (ref 5.0–8.0)

## 2016-05-24 NOTE — Patient Instructions (Signed)
Try to eat a healthy diet and exercise regularly Use barrier protection against STIs. Will order pelvic us if urine ok.

## 2016-05-24 NOTE — Progress Notes (Signed)
Brooke GongGloria Castaneda, is a 25 y.o. female  RUE:[id number]  JYN:[id number]  DOB - 1991-05-15  CC:  Chief Complaint  Patient presents with  . Establish Care    urinary hesitancy   . Abdominal Pain    lower abdominal pain x 3 weeks        HPI: Brooke BondsGloria Castaneda is a 25 y.o. female here to establish care. She has a history of asthma. She has albuterol which she rarely uses. She has a past history of migraines, no current issues. [facility]. She reports she continues to have vague suprabupic and lower abd discomfort. No freq, urgency, dysuria. She also mentions lesions on her legs that she has been told were MRSA. She is not aware of cultures confirming this.  She has a diagnosis of ADD, depression and anxiety. She is followed and is on medication per mental health. She reports smoking an occ cigarette, occ wine, no illicit drug use.  Allergies  Allergen Reactions  . Flagyl [Metronidazole] Diarrhea  . Toradol [Ketorolac Tromethamine] Other (See Comments)    Reaction:  Tachycardia  . Tramadol Other (See Comments)    Reaction:  Tachycardia   Past Medical History:  Diagnosis Date  . ADD (attention deficit disorder)   . ADHD (attention deficit hyperactivity disorder)   . Anxiety    sees Dr. Evelene CroonKaur   . Asthma   . Depression    sees Dr. Evelene CroonKaur   . Hx of varicella   . Migraines    sees Dr. Asa LenteElliot Lewitt  . OCD (obsessive compulsive disorder)   . Panic disorder   . Vaginal Pap smear, abnormal    Current Outpatient Prescriptions on File Prior to Visit  Medication Sig Dispense Refill  . albuterol (PROVENTIL HFA;VENTOLIN HFA) 108 (90 BASE) MCG/ACT inhaler Inhale 2 puffs into the lungs every 4 (four) hours as needed for wheezing. 1 Inhaler 11  . calcium carbonate (TUMS - DOSED IN MG ELEMENTAL CALCIUM) 500 MG chewable tablet Chew 1-2 tablets by mouth 3 (three) times daily as needed for indigestion or heartburn.     Marland Kitchen. FLUoxetine (PROZAC) 10 MG capsule  Take 10 mg by mouth daily.     . hydrOXYzine (ATARAX/VISTARIL) 25 MG tablet Take 1 tablet (25 mg total) by mouth every 8 (eight) hours as needed for anxiety or nausea. (Patient not taking: Reported on 05/24/2016) 12 tablet 0  . ibuprofen (ADVIL,MOTRIN) 600 MG tablet Take 1 tablet (600 mg total) by mouth every 6 (six) hours. (Patient not taking: Reported on 05/24/2016) 30 tablet 0  . oxyCODONE-acetaminophen (PERCOCET/ROXICET) 5-325 MG per tablet Take 2 tablets by mouth every 4 (four) hours as needed for severe pain. (Patient not taking: Reported on 05/24/2016) 30 tablet 0  . oxyCODONE-acetaminophen (PERCOCET/ROXICET) 5-325 MG per tablet Take 1 tablet by mouth every 4 (four) hours as needed (for pain scale 4-7). (Patient not taking: Reported on 05/24/2016) 10 tablet 0  . Prenatal Vit-Fe Fumarate-FA (PRENATAL MULTIVITAMIN) TABS tablet Take 1 tablet by mouth daily.    . ranitidine (ZANTAC) 75 MG tablet Take 75 mg by mouth 2 (two) times daily.     No current facility-administered medications on file prior to visit.    Family History  Problem Relation Age of Onset  . Thyroid disease Mother   . Diabetes Maternal Grandmother    Social History   Social History  . Marital status: Single    Spouse name: N/A  . Number of  children: 0  . Years of education: N/A   Occupational History  . Disabled [employer]   Social History Main Topics  . Smoking status: Current Some Day Smoker  . Smokeless tobacco: Never Used     Comment: or less  . Alcohol use 0.0 oz/week     Comment: glass of wine every other day  . Drug use: No  . Sexual activity: Yes    Birth control/ protection: None     Comment: last intercourse:  one month ago   Other Topics Concern  . Not on file   Social History Narrative  . No narrative on file    Review of Systems: Constitutional: Negative for fever, chills, appetite change, weight loss,  Fatigue. Skin: Positive for sores on legs HENT: Negative for ear pain, ear  discharge.nose bleeds Eyes: Negative for pain, discharge, redness, itching and visual disturbance. Neck: Negative for pain, stiffness Respiratory: Negative for cough, shortness of breath,   Cardiovascular: Negative for chest pain, palpitations and leg swelling. Gastrointestinal: Positive for lower abd discomfort. Positive for some nausea related to her anxiety Genitourinary: Negative for dysuria, urgency, frequency, hematuria,  Musculoskeletal: Negative for back pain, joint pain, joint  swelling, and gait problem.Negative for weakness. Neurological: Negative for dizziness, tremors, seizures, syncope,   light-headedness, numbness and headaches.  Hematological: Negative for easy bruising or bleeding Psychiatric/Behavioral: Positive for depression, anxiety,panic attacks.   Objective:   Vitals:   05/24/16 0840  BP: 104/67  Pulse: 99  Resp: 16  Temp: 98.2 F (36.8 C)    Physical Exam: Constitutional: Patient appears well-developed and well-nourished. No distress. HENT: Normocephalic, atraumatic, External right and left ear normal. Oropharynx is clear and moist.  Eyes: Conjunctivae and EOM are normal. PERRLA, no scleral icterus. Neck: Normal ROM. Neck supple. No lymphadenopathy, No thyromegaly. CVS: RRR, S1/S2 +, no murmurs, no gallops, no rubs Pulmonary: Effort and breath sounds normal, no stridor, rhonchi, wheezes, rales.  Abdominal: Soft. Normoactive BS,, no distension, tenderness, rebound or guarding.  Musculoskeletal: Normal range of motion. No edema and no tenderness.  Neuro: Alert.Normal muscle tone coordination. Non-focal Skin: Skin is warm and dry. No rash noted. Not diaphoretic. No erythema. No pallor.There are a few discolored papules on the back of her left leg. There is no redness and no drainage. Look like healing insect bites. Psychiatric: Normal mood and affect. Behavior, judgment, thought content normal.  Lab Results  Component Value Date   WBC 13.4 (H) 04/27/2015    HGB 9.1 (L) 04/27/2015   HCT 27.5 (L) 04/27/2015   MCV 89.6 04/27/2015   PLT 239 04/27/2015   Lab Results  Component Value Date   CREATININE 0.63 11/25/2012   BUN 13 11/25/2012   NA 137 11/25/2012   K 3.9 11/25/2012   CL 100 11/25/2012   CO2 27 11/25/2012    No results found for: HGBA1C Lipid Panel     Component Value Date/Time   CHOL 172 11/10/2012 0854   TRIG 106.0 11/10/2012 0854   HDL 58.80 11/10/2012 0854   CHOLHDL 3 11/10/2012 0854   VLDL 21.2 11/10/2012 0854   LDLCALC 92 11/10/2012 0854       Assessment and plan:   1. Encounter to establish care - I have reviewed information presented by the patient and pertinent information from her chart.  2. Pelvic pain in female  - Cervicovaginal ancillary only - Urine culture  3. Healthcare maintenance  - COMPLETE METABOLIC PANEL WITH GFR  4. Anemia, unspecified anemia type  -  CBC w/Diff   Return in about 1 year (around 05/24/2017).  The patient was given clear instructions to go to ER or return to medical center if symptoms don't improve, worsen or new problems develop. The patient verbalized understanding.    Henrietta Hoover FNP  05/24/2016, 9:27 AM

## 2016-05-25 LAB — URINE CULTURE: Organism ID, Bacteria: NO GROWTH

## 2016-05-25 LAB — CERVICOVAGINAL ANCILLARY ONLY
CHLAMYDIA, DNA PROBE: NEGATIVE
NEISSERIA GONORRHEA: NEGATIVE
Trichomonas: NEGATIVE

## 2016-05-30 LAB — URINE CYTOLOGY ANCILLARY ONLY
Bacterial vaginitis: POSITIVE — AB
Candida vaginitis: NEGATIVE

## 2016-05-31 ENCOUNTER — Telehealth: Payer: Self-pay

## 2016-05-31 ENCOUNTER — Other Ambulatory Visit: Payer: Self-pay | Admitting: Family Medicine

## 2016-05-31 MED ORDER — CLINDAMYCIN HCL 300 MG PO CAPS: 300.0000 mg | ORAL_CAPSULE | Freq: Two times a day (BID) | ORAL | 0 refills | Status: AC

## 2016-05-31 NOTE — Telephone Encounter (Signed)
Called patient back, please see telephone call from today. Thanks!

## 2016-05-31 NOTE — Telephone Encounter (Signed)
Patient called back and I advised of BV and the need to start antibiotic that was sent to pharmacy and complete all of the medication. Patient verbalized understanding. Thanks!

## 2016-05-31 NOTE — Telephone Encounter (Signed)
-----   Message from Henrietta HooverLinda C Bernhardt, NP sent at 05/31/2016  8:05 AM EDT ----- Have seen in antibiotic for BV.

## 2016-05-31 NOTE — Telephone Encounter (Signed)
Called, no answer. Left message for patient to call back.  

## 2017-01-14 ENCOUNTER — Encounter: Payer: Self-pay | Admitting: Family Medicine

## 2017-01-14 ENCOUNTER — Ambulatory Visit (INDEPENDENT_AMBULATORY_CARE_PROVIDER_SITE_OTHER): Payer: Medicaid Other | Admitting: Family Medicine

## 2017-01-14 VITALS — BP 104/66 | HR 80 | Temp 97.4°F | Ht 68.0 in | Wt 141.0 lb

## 2017-01-14 DIAGNOSIS — F419 Anxiety disorder, unspecified: Secondary | ICD-10-CM | POA: Diagnosis not present

## 2017-01-14 DIAGNOSIS — Z3A01 Less than 8 weeks gestation of pregnancy: Secondary | ICD-10-CM

## 2017-01-14 LAB — COMPREHENSIVE METABOLIC PANEL
ALT: 7 U/L (ref 6–29)
AST: 11 U/L (ref 10–30)
Albumin: 4.4 g/dL (ref 3.6–5.1)
Alkaline Phosphatase: 58 U/L (ref 33–115)
BUN: 8 mg/dL (ref 7–25)
CHLORIDE: 103 mmol/L (ref 98–110)
CO2: 24 mmol/L (ref 20–31)
CREATININE: 0.71 mg/dL (ref 0.50–1.10)
Calcium: 9.3 mg/dL (ref 8.6–10.2)
GLUCOSE: 79 mg/dL (ref 65–99)
Potassium: 4.1 mmol/L (ref 3.5–5.3)
Sodium: 139 mmol/L (ref 135–146)
Total Bilirubin: 0.6 mg/dL (ref 0.2–1.2)
Total Protein: 7.2 g/dL (ref 6.1–8.1)

## 2017-01-14 LAB — POCT URINE PREGNANCY: Preg Test, Ur: POSITIVE — AB

## 2017-01-14 MED ORDER — FOLIC ACID 800 MCG PO TABS: 400.0000 ug | ORAL_TABLET | Freq: Every day | ORAL | 1 refills | Status: DC

## 2017-01-14 NOTE — Patient Instructions (Addendum)
Estimated due date 09/18/2017  Start 400 mcg daily of Folic Acid to prevent neural tube defect.  If you are currently smoking or exposed to cigarette smoke, discontinue exposure as this can cause harm to your unborn baby.   If Medicaid is requiring you to have an ulta-sound, please have your worker submit documentation to my attention and Ill be happy to submit the referral.  I will submit the referral to your OB-GYN upon receipt of your lab results. Follow-up with your psychiatric regarding an appropriate regimen during pregnancy.   First Trimester of Pregnancy The first trimester of pregnancy is from week 1 until the end of week 13 (months 1 through 3). A week after a sperm fertilizes an egg, the egg will implant on the wall of the uterus. This embryo will begin to develop into a baby. Genes from you and your partner will form the baby. The female genes will determine whether the baby will be a boy or a girl. At 6-8 weeks, the eyes and face will be formed, and the heartbeat can be seen on ultrasound. At the end of 12 weeks, all the baby's organs will be formed. Now that you are pregnant, you will want to do everything you can to have a healthy baby. Two of the most important things are to get good prenatal care and to follow your health care provider's instructions. Prenatal care is all the medical care you receive before the baby's birth. This care will help prevent, find, and treat any problems during the pregnancy and childbirth. Body changes during your first trimester Your body goes through many changes during pregnancy. The changes vary from woman to woman.  You may gain or lose a couple of pounds at first.  You may feel sick to your stomach (nauseous) and you may throw up (vomit). If the vomiting is uncontrollable, call your health care provider.  You may tire easily.  You may develop headaches that can be relieved by medicines. All medicines should be approved by your health care  provider.  You may urinate more often. Painful urination may mean you have a bladder infection.  You may develop heartburn as a result of your pregnancy.  You may develop constipation because certain hormones are causing the muscles that push stool through your intestines to slow down.  You may develop hemorrhoids or swollen veins (varicose veins).  Your breasts may begin to grow larger and become tender. Your nipples may stick out more, and the tissue that surrounds them (areola) may become darker.  Your gums may bleed and may be sensitive to brushing and flossing.  Dark spots or blotches (chloasma, mask of pregnancy) may develop on your face. This will likely fade after the baby is born.  Your menstrual periods will stop.  You may have a loss of appetite.  You may develop cravings for certain kinds of food.  You may have changes in your emotions from day to day, such as being excited to be pregnant or being concerned that something may go wrong with the pregnancy and baby.  You may have more vivid and strange dreams.  You may have changes in your hair. These can include thickening of your hair, rapid growth, and changes in texture. Some women also have hair loss during or after pregnancy, or hair that feels dry or thin. Your hair will most likely return to normal after your baby is born. What to expect at prenatal visits During a routine prenatal visit:  You  will be weighed to make sure you and the baby are growing normally.  Your blood pressure will be taken.  Your abdomen will be measured to track your baby's growth.  The fetal heartbeat will be listened to between weeks 10 and 14 of your pregnancy.  Test results from any previous visits will be discussed. Your health care provider may ask you:  How you are feeling.  If you are feeling the baby move.  If you have had any abnormal symptoms, such as leaking fluid, bleeding, severe headaches, or abdominal cramping.  If  you are using any tobacco products, including cigarettes, chewing tobacco, and electronic cigarettes.  If you have any questions. Other tests that may be performed during your first trimester include:  Blood tests to find your blood type and to check for the presence of any previous infections. The tests will also be used to check for low iron levels (anemia) and protein on red blood cells (Rh antibodies). Depending on your risk factors, or if you previously had diabetes during pregnancy, you may have tests to check for high blood sugar that affects pregnant women (gestational diabetes).  Urine tests to check for infections, diabetes, or protein in the urine.  An ultrasound to confirm the proper growth and development of the baby.  Fetal screens for spinal cord problems (spina bifida) and Down syndrome.  HIV (human immunodeficiency virus) testing. Routine prenatal testing includes screening for HIV, unless you choose not to have this test.  You may need other tests to make sure you and the baby are doing well. Follow these instructions at home: Medicines   Follow your health care provider's instructions regarding medicine use. Specific medicines may be either safe or unsafe to take during pregnancy.  Take a prenatal vitamin that contains at least 600 micrograms (mcg) of folic acid.  If you develop constipation, try taking a stool softener if your health care provider approves. Eating and drinking   Eat a balanced diet that includes fresh fruits and vegetables, whole grains, good sources of protein such as meat, eggs, or tofu, and low-fat dairy. Your health care provider will help you determine the amount of weight gain that is right for you.  Avoid raw meat and uncooked cheese. These carry germs that can cause birth defects in the baby.  Eating four or five small meals rather than three large meals a day may help relieve nausea and vomiting. If you start to feel nauseous, eating a few  soda crackers can be helpful. Drinking liquids between meals, instead of during meals, also seems to help ease nausea and vomiting.  Limit foods that are high in fat and processed sugars, such as fried and sweet foods.  To prevent constipation:  Eat foods that are high in fiber, such as fresh fruits and vegetables, whole grains, and beans.  Drink enough fluid to keep your urine clear or pale yellow. Activity   Exercise only as directed by your health care provider. Most women can continue their usual exercise routine during pregnancy. Try to exercise for 30 minutes at least 5 days a week. Exercising will help you:  Control your weight.  Stay in shape.  Be prepared for labor and delivery.  Experiencing pain or cramping in the lower abdomen or lower back is a good sign that you should stop exercising. Check with your health care provider before continuing with normal exercises.  Try to avoid standing for long periods of time. Move your legs often if you  must stand in one place for a long time.  Avoid heavy lifting.  Wear low-heeled shoes and practice good posture.  You may continue to have sex unless your health care provider tells you not to. Relieving pain and discomfort   Wear a good support bra to relieve breast tenderness.  Take warm sitz baths to soothe any pain or discomfort caused by hemorrhoids. Use hemorrhoid cream if your health care provider approves.  Rest with your legs elevated if you have leg cramps or low back pain.  If you develop varicose veins in your legs, wear support hose. Elevate your feet for 15 minutes, 3-4 times a day. Limit salt in your diet. Prenatal care   Schedule your prenatal visits by the twelfth week of pregnancy. They are usually scheduled monthly at first, then more often in the last 2 months before delivery.  Write down your questions. Take them to your prenatal visits.  Keep all your prenatal visits as told by your health care provider.  This is important. Safety   Wear your seat belt at all times when driving.  Make a list of emergency phone numbers, including numbers for family, friends, the hospital, and police and fire departments. General instructions   Ask your health care provider for a referral to a local prenatal education class. Begin classes no later than the beginning of month 6 of your pregnancy.  Ask for help if you have counseling or nutritional needs during pregnancy. Your health care provider can offer advice or refer you to specialists for help with various needs.  Do not use hot tubs, steam rooms, or saunas.  Do not douche or use tampons or scented sanitary pads.  Do not cross your legs for long periods of time.  Avoid cat litter boxes and soil used by cats. These carry germs that can cause birth defects in the baby and possibly loss of the fetus by miscarriage or stillbirth.  Avoid all smoking, herbs, alcohol, and medicines not prescribed by your health care provider. Chemicals in these products affect the formation and growth of the baby.  Do not use any products that contain nicotine or tobacco, such as cigarettes and e-cigarettes. If you need help quitting, ask your health care provider. You may receive counseling support and other resources to help you quit.  Schedule a dentist appointment. At home, brush your teeth with a soft toothbrush and be gentle when you floss. Contact a health care provider if:  You have dizziness.  You have mild pelvic cramps, pelvic pressure, or nagging pain in the abdominal area.  You have persistent nausea, vomiting, or diarrhea.  You have a bad smelling vaginal discharge.  You have pain when you urinate.  You notice increased swelling in your face, hands, legs, or ankles.  You are exposed to fifth disease or chickenpox.  You are exposed to [place] measles (rubella) and have never had it. Get help right away if:  You have a fever.  You are leaking fluid  from your vagina.  You have spotting or bleeding from your vagina.  You have severe abdominal cramping or pain.  You have rapid weight gain or loss.  You vomit blood or material that looks like coffee grounds.  You develop a severe headache.  You have shortness of breath.  You have any kind of trauma, such as from a fall or a car accident. Summary  The first trimester of pregnancy is from week 1 until the end of week 13 (months  1 through 3).  Your body goes through many changes during pregnancy. The changes vary from woman to woman.  You will have routine prenatal visits. During those visits, your health care provider will examine you, discuss any test results you may have, and talk with you about how you are feeling. This information is not intended to replace advice given to you by your health care provider. Make sure you discuss any questions you have with your health care provider. Document Released: 09/25/2001 Document Revised: 09/12/2016 Document Reviewed: 09/12/2016 Elsevier Interactive Patient Education  2017 [employer].  Eating Plan for Pregnant Women While you are pregnant, your body will require additional nutrition to help support your growing baby. It is recommended that you consume:  150 additional calories each day during your first trimester.  300 additional calories each day during your second trimester.  300 additional calories each day during your third trimester. Eating a healthy, well-balanced diet is very important for your health and for your baby's health. You also have a higher need for some vitamins and minerals, such as folic acid, calcium, iron, and vitamin D. What do I need to know about eating during pregnancy?  Do not try to lose weight or go on a diet during pregnancy.  Choose healthy, nutritious foods. Choose  of a sandwich with a glass of milk instead of a candy bar or a high-calorie sugar-sweetened beverage.  Limit your overall intake of  foods that have "empty calories." These are foods that have little nutritional value, such as sweets, desserts, candies, sugar-sweetened beverages, and fried foods.  Eat a variety of foods, especially fruits and vegetables.  Take a prenatal vitamin to help meet the additional needs during pregnancy, specifically for folic acid, iron, calcium, and vitamin D.  Remember to stay active. Ask your health care provider for exercise recommendations that are specific to you.  Practice good food safety and cleanliness, such as washing your hands before you eat and after you prepare raw meat. This helps to prevent foodborne illnesses, such as listeriosis, that can be very dangerous for your baby. Ask your health care provider for more information about listeriosis. What does 150 extra calories look like? Healthy options for an additional 150 calories each day could be any of the following:  Plain low-fat yogurt (6-8 oz) with  cup of berries.  1 apple with 2 teaspoons of peanut butter.  Cut-up vegetables with  cup of hummus.  Low-fat chocolate milk (8 oz or 1 cup).  1 string cheese with 1 medium orange.   of a peanut butter and jelly sandwich on whole-wheat bread (1 tsp of peanut butter). For 300 calories, you could eat two of those healthy options each day. What is a healthy amount of weight to gain? The recommended amount of weight for you to gain is based on your pre-pregnancy BMI. If your pre-pregnancy BMI was:  Less than 18 (underweight), you should gain 28-40 lb.  18-24.9 (normal), you should gain 25-35 lb.  25-29.9 (overweight), you should gain 15-25 lb.  Greater than 30 (obese), you should gain 11-20 lb. What if I am having twins or multiples? Generally, pregnant women who will be having twins or multiples may need to increase their daily calories by 300-600 calories each day. The recommended range for total weight gain is 25-54 lb, depending on your pre-pregnancy BMI. Talk with  your health care provider for specific guidance about additional nutritional needs, weight gain, and exercise during your pregnancy. What foods can  I eat? Grains  Any grains. Try to choose whole grains, such as whole-wheat bread, oatmeal, or brown rice. Vegetables  Any vegetables. Try to eat a variety of colors and types of vegetables to get a full range of vitamins and minerals. Remember to wash your vegetables well before eating. Fruits  Any fruits. Try to eat a variety of colors and types of fruit to get a full range of vitamins and minerals. Remember to wash your fruits well before eating. Meats and Other Protein Sources  Lean meats, including chicken, [place], fish, and lean cuts of beef, veal, or pork. Make sure that all meats are cooked to "well done." Tofu. Tempeh. Beans. Eggs. Peanut butter and other nut butters. Seafood, such as shrimp, crab, and lobster. If you choose fish, select types that are higher in omega-3 fatty acids, including salmon, herring, mussels, trout, sardines, and pollock. Make sure that all meats are cooked to food-safe temperatures. Dairy  Pasteurized milk and milk alternatives. Pasteurized yogurt and pasteurized cheese. Cottage cheese. Sour cream. Beverages  Water. Juices that contain 100% fruit juice or vegetable juice. Caffeine-free teas and decaffeinated coffee. Drinks that contain caffeine are okay to drink, but it is better to avoid caffeine. Keep your total caffeine intake to less than 200 mg each day (12 oz of coffee, tea, or soda) or as directed by your health care provider. Condiments  Any pasteurized condiments. Sweets and Desserts  Any sweets and desserts. Fats and Oils  Any fats and oils. The items listed above may not be a complete list of recommended foods or beverages. Contact your dietitian for more options.  What foods are not recommended? Vegetables  Unpasteurized (raw) vegetable juices. Fruits  Unpasteurized (raw) fruit juices. Meats and  Other Protein Sources  Cured meats that have nitrates, such as bacon, salami, and hotdogs. Luncheon meats, bologna, or other deli meats (unless they are reheated until they are steaming hot). Refrigerated pate, meat spreads from a meat counter, smoked seafood that is found in the refrigerated section of a store. Raw fish, such as sushi or sashimi. High mercury content fish, such as tilefish, shark, swordfish, and king mackerel. Raw meats, such as tuna or beef tartare. Undercooked meats and poultry. Make sure that all meats are cooked to food-safe temperatures. Dairy  Unpasteurized (raw) milk and any foods that have raw milk in them. Soft cheeses, such as feta, queso blanco, queso fresco, Brie, Camembert cheeses, blue-veined cheeses, and Panela cheese (unless it is made with pasteurized milk, which must be stated on the label). Beverages  Alcohol. Sugar-sweetened beverages, such as sodas, teas, or energy drinks. Condiments  Homemade fermented foods and drinks, such as pickles, sauerkraut, or kombucha drinks. (Store-bought pasteurized versions of these are okay.) Other  Salads that are made in the store, such as ham salad, chicken salad, egg salad, tuna salad, and seafood salad. The items listed above may not be a complete list of foods and beverages to avoid. Contact your dietitian for more information.  This information is not intended to replace advice given to you by your health care provider. Make sure you discuss any questions you have with your health care provider. Document Released: 07/16/2014 Document Revised: 03/08/2016 Document Reviewed: 03/16/2014 Elsevier Interactive Patient Education  2017 [employer]    Health Risks of Smoking Smoking cigarettes is very bad for your health. Tobacco smoke has over 200 known poisons in it. It contains the poisonous gases nitrogen oxide and carbon monoxide. There are over 60 chemicals in  tobacco smoke that cause cancer. Smoking is difficult to quit  because a chemical in tobacco, called nicotine, causes addiction or dependence. When you smoke and inhale, nicotine is absorbed rapidly into the bloodstream through your lungs. Both inhaled and non-inhaled nicotine may be addictive. What are the risks of cigarette smoke? Cigarette smokers have an increased risk of many serious medical problems, including:  Lung cancer.  Lung disease, such as pneumonia, bronchitis, and emphysema.  Chest pain (angina) and heart attack because the heart is not getting enough oxygen.  Heart disease and peripheral blood vessel disease.  High blood pressure (hypertension).  Stroke.  Oral cancer, including cancer of the lip, mouth, or voice box.  Bladder cancer.  Pancreatic cancer.  Cervical cancer.  Pregnancy complications, including premature birth.  Stillbirths and smaller newborn babies, birth defects, and genetic damage to sperm.  Early menopause.  Lower estrogen level for women.  Infertility.  Facial wrinkles.  Blindness.  Increased risk of broken bones (fractures).  Senile dementia.  Stomach ulcers and internal bleeding.  Delayed wound healing and increased risk of complications during surgery.  Even smoking lightly shortens your life expectancy by several years. Because of secondhand smoke exposure, children of smokers have an increased risk of the following:  Sudden infant death syndrome (SIDS).  Respiratory infections.  Lung cancer.  Heart disease.  Ear infections. What are the benefits of quitting? There are many health benefits of quitting smoking. Here are some of them:  Within days of quitting smoking, your risk of having a heart attack decreases, your blood flow improves, and your lung capacity improves. Blood pressure, pulse rate, and breathing patterns start returning to normal soon after quitting.  Within months, your lungs may clear up completely.  Quitting for 10 years reduces your risk of developing lung  cancer and heart disease to almost that of a nonsmoker.  People who quit may see an improvement in their overall quality of life. How do I quit smoking? Smoking is an addiction with both physical and psychological effects, and longtime habits can be hard to change. Your health care provider can recommend:  Programs and community resources, which may include group support, education, or talk therapy.  Prescription medicines to help reduce cravings.  Nicotine replacement products, such as patches, gum, and nasal sprays. Use these products only as directed. Do not replace cigarette smoking with electronic cigarettes, which are commonly called e-cigarettes. The safety of e-cigarettes is not known, and some may contain harmful chemicals.  A combination of two or more of these methods. Where to find more information:  American Lung Association: www.lung.org  American Cancer Society: www.cancer.org Summary  Smoking cigarettes is very bad for your health. Cigarette smokers have an increased risk of many serious medical problems, including several cancers, heart disease, and stroke.  Smoking is an addiction with both physical and psychological effects, and longtime habits can be hard to change.  By stopping right away, you can greatly reduce the risk of medical problems for you and your family.  To help you quit smoking, your health care provider can recommend programs, community resources, prescription medicines, and nicotine replacement products such as patches, gum, and nasal sprays. This information is not intended to replace advice given to you by your health care provider. Make sure you discuss any questions you have with your health care provider. Document Released: 11/08/2004 Document Revised: 10/05/2016 Document Reviewed: 10/05/2016 Elsevier Interactive Patient Education  2017 [employer].

## 2017-01-14 NOTE — Progress Notes (Signed)
Patient ID: Brooke Castaneda., female    DOB: July 25, 1991, 26 y.o.   MRN: [id number]  PCP: Joaquin Courts, FNP  Chief Complaint  Patient presents with  . POSTIVE PREGNANCY    LOW IRON  . Anxiety    Subjective:  HPI  Brooke Heuskin V. is a 26 y.o. female presents for evaluation of amenorrhea. Chronic problems include excessive anxiety, depression, and tobacco smoker. She is under the care of psychiatric at [facility] on [place]. , [place]. She presents today with her significant other. [name] is the mother of a 4 year old son. She report last menstrual period was 12/11/2016. Recently had IUD removed in December. She missed her period for March and took a home pregnancy test which was positive. She presents today requesting anxiety medication and pregnancy verification in order to have a letter to provide to Brooke Castaneda worker. She thinks her Medicaid worker also request that she receive an ultrasound to verify pregnancy. Ashanta reports that she has stopped taking her Xanax and has noticed worsening anxiety. She takes Abilify and Prozac also prescribed by her psychiatric which she is continuing to take both medications. Denies any problems with hypertension or diabetes during her last pregnancy. She also reports that she was prescribed Prozac throughout her pregnancy without complications. Jillian reports a suspecting that she was tx for iron deficiency during her pregnancy although she isn't complete certain. Patient denies any drug or alcohol use within the last 60 days as well as she denies any trauma or injury to abdomen.    Social History   Social History  . Marital status: Single    Spouse name: N/A  . Number of children: 0  . Years of education: N/A   Occupational History  . Disabled [employer]   Social History Main Topics  . Smoking status: Current Some Day Smoker  . Smokeless tobacco: Never Used     Comment: or less  . Alcohol use 0.0  oz/week     Comment: glass of wine every other day  . Drug use: No  . Sexual activity: Yes    Birth control/ protection: None     Comment: last intercourse:  one month ago   Other Topics Concern  . Not on file   Social History Narrative  . No narrative on file    Family History  Problem Relation Age of Onset  . Thyroid disease Mother   . Diabetes Maternal Grandmother    Review of Systems SEE HPI Patient Active Problem List   Diagnosis Date Noted  . Pregnancy 04/26/2015  . Abdominal pain, right lower quadrant 04/08/2013  . Interstitial cystitis 03/17/2013  . Acute otitis media, bilateral 09/10/2011  . Active smoker 09/10/2011  . URI, acute 09/10/2011  . UTI 10/30/2010  . BACTERIAL VAGINITIS 10/30/2010  . PARESTHESIA 06/09/2010  . DYSFUNCTION OF EUSTACHIAN TUBE 03/22/2010  . CERVICAL STRAIN, ACUTE 03/22/2010  . MIGRAINE HEADACHE 03/17/2010  . ACUTE SINUSITIS, UNSPECIFIED 03/17/2010  . ASTHMA 08/10/2009  . VIRAL URI 09/22/2007  . ANXIETY 07/09/2007  . DEPRESSION 07/09/2007  . ADD 07/09/2007    Allergies  Allergen Reactions  . Flagyl [Metronidazole] Diarrhea  . Toradol [Ketorolac Tromethamine] Other (See Comments)    Reaction:  Tachycardia  . Tramadol Other (See Comments)    Reaction:  Tachycardia    Prior to Admission medications   Medication Sig Start Date End Date Taking? Authorizing Provider  albuterol (PROVENTIL HFA;VENTOLIN HFA) 108 (90 BASE) MCG/ACT inhaler Inhale 2 puffs  into the lungs every 4 (four) hours as needed for wheezing. 02/26/14   Nelwyn Salisbury, MD  ALPRAZolam Prudy Feeler) 0.5 MG tablet Take 0.5 mg by mouth 3 (three) times daily as needed for anxiety.    Historical Provider, MD  calcium carbonate (TUMS - DOSED IN MG ELEMENTAL CALCIUM) 500 MG chewable tablet Chew 1-2 tablets by mouth 3 (three) times daily as needed for indigestion or heartburn.     Historical Provider, MD  FLUoxetine (PROZAC) 10 MG capsule Take 10 mg by mouth daily.     Historical  Provider, MD  hydrOXYzine (ATARAX/VISTARIL) 25 MG tablet Take 1 tablet (25 mg total) by mouth every 8 (eight) hours as needed for anxiety or nausea. Patient not taking: Reported on 05/24/2016 04/17/16   Fayrene Helper, PA-C  ibuprofen (ADVIL,MOTRIN) 600 MG tablet Take 1 tablet (600 mg total) by mouth every 6 (six) hours. Patient not taking: Reported on 05/24/2016 04/28/15   Zelphia Cairo, MD  OLANZapine (ZYPREXA) 2.5 MG tablet Take 2.5 mg by mouth at bedtime.    Historical Provider, MD  oxyCODONE-acetaminophen (PERCOCET/ROXICET) 5-325 MG per tablet Take 2 tablets by mouth every 4 (four) hours as needed for severe pain. Patient not taking: Reported on 05/24/2016 02/14/15   Montez Morita, CNM  oxyCODONE-acetaminophen (PERCOCET/ROXICET) 5-325 MG per tablet Take 1 tablet by mouth every 4 (four) hours as needed (for pain scale 4-7). Patient not taking: Reported on 05/24/2016 04/28/15   Zelphia Cairo, MD  Prenatal Vit-Fe Fumarate-FA (PRENATAL MULTIVITAMIN) TABS tablet Take 1 tablet by mouth daily.    Historical Provider, MD  promethazine (PHENERGAN) 25 MG tablet Take 25 mg by mouth 2 (two) times daily.    Historical Provider, MD  ranitidine (ZANTAC) 75 MG tablet Take 75 mg by mouth 2 (two) times daily.    Historical Provider, MD    Past Medical, Surgical Family and Social History reviewed and updated.    Objective:  There were no vitals filed for this visit.  Wt Readings from Last 3 Encounters:  05/24/16 163 lb (73.9 kg)  04/16/16 170 lb (77.1 kg)  04/26/15 205 lb (93 kg)    Physical Exam  Constitutional: She appears well-developed and well-nourished.  HENT:  Head: Normocephalic and atraumatic.  Cardiovascular: Normal rate, regular rhythm, normal heart sounds and intact distal pulses.   Pulmonary/Chest: Effort normal and breath sounds normal.  Abdominal: Soft. Bowel sounds are normal. She exhibits no distension. There is no tenderness.  Skin: Skin is warm and dry.  Psychiatric: Thought content  normal. Her mood appears anxious. Her speech is rapid and/or pressured. She is hyperactive. Cognition and memory are normal. She expresses impulsivity.       Assessment & Plan:  1. Less than [redacted] weeks gestation of pregnancy - POCT urine pregnancy - hCG, quantitative, pregnancy - Comprehensive metabolic panel - CBC with Differential/Platelet - Folate RBC - Iron, TIBC and Ferritin Panel  2. Anxiety  -I deferring prescribing and evaluating patient's anxiety as she is under the care of psychiatry. This visit was only to establish care and confirm pregnancy. I confirmed patient is no longer taking Xanax , however she is continuing to take Abilify and Prozac which are both category C medication which indicates that there could be fetal harm. This patient is completely new to me and I advised he rot call and schedule an appointment with psychiatrist today in efforts that she can obtain an appointment as soon as possible.   Upon receipt of Quantitative HCG results I  will complete a letter confirming pregnancy for [facility]. If Laird Castaneda are requiring that you have a ultrasound, please forward documentation to me and I will include with your OB referral. Estimated due date 09/18/2017, estimated gestation  4 weeks. Folic acid 400 mcg daily. She requests to be referred to [facility] Dr. Mitchel Honour once pregnancy is confirmed.   Godfrey Pick. Tiburcio Pea, MSN, [facility] [address] [place], [place] [address] [phone]

## 2017-01-15 LAB — CBC WITH DIFFERENTIAL/PLATELET
BASOS ABS: 0 {cells}/uL (ref 0–200)
Basophils Relative: 0 %
EOS ABS: 0 {cells}/uL — AB (ref 15–500)
Eosinophils Relative: 0 %
HEMATOCRIT: 44.1 % (ref 35.0–45.0)
Hemoglobin: 14.5 g/dL (ref 11.7–15.5)
LYMPHS PCT: 12 %
Lymphs Abs: 996 cells/uL (ref 850–3900)
MCH: 30.5 pg (ref 27.0–33.0)
MCHC: 32.9 g/dL (ref 32.0–36.0)
MCV: 92.8 fL (ref 80.0–100.0)
MONO ABS: 415 {cells}/uL (ref 200–950)
MPV: 10.3 fL (ref 7.5–12.5)
Monocytes Relative: 5 %
NEUTROS PCT: 83 %
Neutro Abs: 6889 cells/uL (ref 1500–7800)
Platelets: 283 10*3/uL (ref 140–400)
RBC: 4.75 MIL/uL (ref 3.80–5.10)
RDW: 13.6 % (ref 11.0–15.0)
WBC: 8.3 10*3/uL (ref 3.8–10.8)

## 2017-01-15 LAB — IRON,TIBC AND FERRITIN PANEL
%SAT: 27 % (ref 11–50)
Ferritin: 35 ng/mL (ref 10–154)
Iron: 81 ug/dL (ref 40–190)
TIBC: 305 ug/dL (ref 250–450)

## 2017-01-15 LAB — HCG, QUANTITATIVE, PREGNANCY: HCG, BETA CHAIN, QUANT, S: 15007.5 m[IU]/mL — AB

## 2017-01-15 LAB — FOLATE RBC: RBC Folate: 429 ng/mL (ref >280)

## 2017-01-16 ENCOUNTER — Encounter: Payer: Self-pay | Admitting: Family Medicine

## 2017-01-16 ENCOUNTER — Telehealth: Payer: Self-pay | Admitting: Family Medicine

## 2017-01-16 NOTE — Telephone Encounter (Signed)
Letter completed and forward to [place], CMA to print and call patient to advise document is ready for pick-up.

## 2017-01-16 NOTE — Addendum Note (Signed)
Addended by: Bing Neighbors on: 01/16/2017 07:02 PM   Modules accepted: Orders

## 2017-01-17 NOTE — Telephone Encounter (Signed)
Patient notiifed

## 2017-02-14 LAB — OB RESULTS CONSOLE GC/CHLAMYDIA
Chlamydia: NEGATIVE
GC PROBE AMP, GENITAL: NEGATIVE

## 2017-02-14 LAB — OB RESULTS CONSOLE HEPATITIS B SURFACE ANTIGEN: Hepatitis B Surface Ag: NEGATIVE

## 2017-02-14 LAB — OB RESULTS CONSOLE HIV ANTIBODY (ROUTINE TESTING): HIV: NONREACTIVE

## 2017-02-14 LAB — OB RESULTS CONSOLE ABO/RH: RH TYPE: POSITIVE

## 2017-02-14 LAB — OB RESULTS CONSOLE ANTIBODY SCREEN: Antibody Screen: NEGATIVE

## 2017-02-14 LAB — OB RESULTS CONSOLE RPR: RPR: NONREACTIVE

## 2017-02-14 LAB — OB RESULTS CONSOLE RUBELLA ANTIBODY, IGM: RUBELLA: IMMUNE

## 2017-08-28 ENCOUNTER — Encounter (HOSPITAL_COMMUNITY): Payer: Self-pay | Admitting: *Deleted

## 2017-08-28 ENCOUNTER — Telehealth (HOSPITAL_COMMUNITY): Payer: Self-pay | Admitting: *Deleted

## 2017-08-28 NOTE — Telephone Encounter (Signed)
Preadmission screen  

## 2017-09-01 ENCOUNTER — Encounter (HOSPITAL_COMMUNITY): Payer: Self-pay

## 2017-09-01 ENCOUNTER — Inpatient Hospital Stay (HOSPITAL_COMMUNITY)
Admission: AD | Admit: 2017-09-01 | Discharge: 2017-09-02 | DRG: 807 | Disposition: A | Discharge status: Home / Self Care | Payer: Medicare Other | Source: Ambulatory Visit | Attending: Obstetrics and Gynecology | Admitting: Obstetrics and Gynecology

## 2017-09-01 ENCOUNTER — Inpatient Hospital Stay (HOSPITAL_COMMUNITY)
Admission: AD | Admit: 2017-09-01 | Discharge: 2017-09-01 | Disposition: A | Discharge status: Home / Self Care | Payer: Medicare Other | Source: Ambulatory Visit | Attending: Obstetrics and Gynecology | Admitting: Obstetrics and Gynecology

## 2017-09-01 ENCOUNTER — Other Ambulatory Visit: Payer: Self-pay

## 2017-09-01 ENCOUNTER — Inpatient Hospital Stay (HOSPITAL_COMMUNITY): Payer: Medicare Other | Admitting: Anesthesiology

## 2017-09-01 ENCOUNTER — Encounter (HOSPITAL_COMMUNITY): Payer: Self-pay | Admitting: *Deleted

## 2017-09-01 DIAGNOSIS — Z3A38 38 weeks gestation of pregnancy: Secondary | ICD-10-CM

## 2017-09-01 DIAGNOSIS — O479 False labor, unspecified: Secondary | ICD-10-CM | POA: Diagnosis present

## 2017-09-01 DIAGNOSIS — Z3483 Encounter for supervision of other normal pregnancy, third trimester: Secondary | ICD-10-CM | POA: Diagnosis present

## 2017-09-01 DIAGNOSIS — F17210 Nicotine dependence, cigarettes, uncomplicated: Secondary | ICD-10-CM | POA: Diagnosis present

## 2017-09-01 DIAGNOSIS — O99334 Smoking (tobacco) complicating childbirth: Secondary | ICD-10-CM | POA: Diagnosis present

## 2017-09-01 LAB — CBC
HCT: 34.9 % — ABNORMAL LOW (ref 36.0–46.0)
HEMOGLOBIN: 11.5 g/dL — AB (ref 12.0–15.0)
MCH: 30.4 pg (ref 26.0–34.0)
MCHC: 33 g/dL (ref 30.0–36.0)
MCV: 92.3 fL (ref 78.0–100.0)
Platelets: 299 10*3/uL (ref 150–400)
RBC: 3.78 MIL/uL — ABNORMAL LOW (ref 3.87–5.11)
RDW: 14.7 % (ref 11.5–15.5)
WBC: 12.9 10*3/uL — AB (ref 4.0–10.5)

## 2017-09-01 LAB — TYPE AND SCREEN
ABO/RH(D): O POS
ANTIBODY SCREEN: NEGATIVE

## 2017-09-01 MED ORDER — LACTATED RINGERS IV SOLN
INTRAVENOUS | Status: DC
  Administered 2017-09-01: 10:00:00 via INTRAVENOUS

## 2017-09-01 MED ORDER — ACETAMINOPHEN 325 MG PO TABS
650.0000 mg | ORAL_TABLET | ORAL | Status: DC | PRN
  Administered 2017-09-01 – 2017-09-02 (×3): 650 mg via ORAL
  Filled 2017-09-01 (×3): qty 2

## 2017-09-01 MED ORDER — BENZOCAINE-MENTHOL 20-0.5 % EX AERO
1.0000 "application " | INHALATION_SPRAY | CUTANEOUS | Status: DC | PRN
  Administered 2017-09-02: 1 via TOPICAL
  Filled 2017-09-01: qty 56

## 2017-09-01 MED ORDER — DIPHENHYDRAMINE HCL 25 MG PO CAPS: 25.0000 mg | ORAL_CAPSULE | Freq: Four times a day (QID) | ORAL | Status: DC | PRN

## 2017-09-01 MED ORDER — ACETAMINOPHEN 325 MG PO TABS: 650.0000 mg | ORAL_TABLET | ORAL | Status: DC | PRN

## 2017-09-01 MED ORDER — SENNOSIDES-DOCUSATE SODIUM 8.6-50 MG PO TABS
2.0000 | ORAL_TABLET | ORAL | Status: DC
  Administered 2017-09-02: 2 via ORAL
  Filled 2017-09-01: qty 2

## 2017-09-01 MED ORDER — LIDOCAINE HCL (PF) 1 % IJ SOLN
30.0000 mL | INTRAMUSCULAR | Status: DC | PRN
  Filled 2017-09-01: qty 30

## 2017-09-01 MED ORDER — OXYCODONE HCL 5 MG PO TABS: 5.0000 mg | ORAL_TABLET | ORAL | Status: DC | PRN

## 2017-09-01 MED ORDER — ZOLPIDEM TARTRATE 5 MG PO TABS: 5.0000 mg | ORAL_TABLET | Freq: Every evening | ORAL | Status: DC | PRN

## 2017-09-01 MED ORDER — OXYTOCIN 40 UNITS IN LACTATED RINGERS INFUSION - SIMPLE MED: 2.5000 [IU]/h | INTRAVENOUS | Status: DC

## 2017-09-01 MED ORDER — FENTANYL 2.5 MCG/ML BUPIVACAINE 1/10 % EPIDURAL INFUSION (WH - ANES)
14.0000 mL/h | INTRAMUSCULAR | Status: DC | PRN
  Filled 2017-09-01: qty 100

## 2017-09-01 MED ORDER — TETANUS-DIPHTH-ACELL PERTUSSIS 5-2.5-18.5 LF-MCG/0.5 IM SUSP: 0.5000 mL | Freq: Once | INTRAMUSCULAR | Status: DC

## 2017-09-01 MED ORDER — SIMETHICONE 80 MG PO CHEW: 80.0000 mg | CHEWABLE_TABLET | ORAL | Status: DC | PRN

## 2017-09-01 MED ORDER — EPHEDRINE 5 MG/ML INJ
10.0000 mg | INTRAVENOUS | Status: DC | PRN
  Filled 2017-09-01: qty 2

## 2017-09-01 MED ORDER — ONDANSETRON HCL 4 MG PO TABS: 4.0000 mg | ORAL_TABLET | ORAL | Status: DC | PRN

## 2017-09-01 MED ORDER — COCONUT OIL OIL: 1.0000 "application " | TOPICAL_OIL | Status: DC | PRN

## 2017-09-01 MED ORDER — LACTATED RINGERS IV SOLN
500.0000 mL | Freq: Once | INTRAVENOUS | Status: AC
Start: 2017-09-01 — End: 2017-09-01
  Administered 2017-09-01: 500 mL via INTRAVENOUS

## 2017-09-01 MED ORDER — ALBUTEROL SULFATE (2.5 MG/3ML) 0.083% IN NEBU: 3.0000 mL | INHALATION_SOLUTION | RESPIRATORY_TRACT | Status: DC | PRN

## 2017-09-01 MED ORDER — PHENYLEPHRINE 40 MCG/ML (10ML) SYRINGE FOR IV PUSH (FOR BLOOD PRESSURE SUPPORT)
80.0000 ug | PREFILLED_SYRINGE | INTRAVENOUS | Status: DC | PRN
  Filled 2017-09-01: qty 5
  Filled 2017-09-01: qty 10

## 2017-09-01 MED ORDER — OXYTOCIN BOLUS FROM INFUSION: 500.0000 mL | Freq: Once | INTRAVENOUS | Status: DC

## 2017-09-01 MED ORDER — ARIPIPRAZOLE 5 MG PO TABS
2.5000 mg | ORAL_TABLET | Freq: Every day | ORAL | Status: DC
  Administered 2017-09-01 – 2017-09-02 (×2): 2.5 mg via ORAL
  Filled 2017-09-01 (×4): qty 1

## 2017-09-01 MED ORDER — BUTORPHANOL TARTRATE 1 MG/ML IJ SOLN
1.0000 mg | INTRAMUSCULAR | Status: DC | PRN
Start: 2017-09-01 — End: 2017-09-01
  Administered 2017-09-01: 1 mg via INTRAVENOUS
  Filled 2017-09-01: qty 1

## 2017-09-01 MED ORDER — OXYCODONE HCL 5 MG PO TABS: 10.0000 mg | ORAL_TABLET | ORAL | Status: DC | PRN

## 2017-09-01 MED ORDER — WITCH HAZEL-GLYCERIN EX PADS: 1.0000 "application " | MEDICATED_PAD | CUTANEOUS | Status: DC | PRN

## 2017-09-01 MED ORDER — FLEET ENEMA 7-19 GM/118ML RE ENEM: 1.0000 | ENEMA | RECTAL | Status: DC | PRN

## 2017-09-01 MED ORDER — ONDANSETRON HCL 4 MG/2ML IJ SOLN: 4.0000 mg | INTRAMUSCULAR | Status: DC | PRN

## 2017-09-01 MED ORDER — SOD CITRATE-CITRIC ACID 500-334 MG/5ML PO SOLN: 30.0000 mL | ORAL | Status: DC | PRN

## 2017-09-01 MED ORDER — LACTATED RINGERS IV SOLN: 500.0000 mL | INTRAVENOUS | Status: DC | PRN

## 2017-09-01 MED ORDER — PHENYLEPHRINE 40 MCG/ML (10ML) SYRINGE FOR IV PUSH (FOR BLOOD PRESSURE SUPPORT)
80.0000 ug | PREFILLED_SYRINGE | INTRAVENOUS | Status: DC | PRN
  Filled 2017-09-01: qty 5

## 2017-09-01 MED ORDER — PRENATAL MULTIVITAMIN CH
1.0000 | ORAL_TABLET | Freq: Every day | ORAL | Status: DC
  Filled 2017-09-01: qty 1

## 2017-09-01 MED ORDER — ONDANSETRON HCL 4 MG/2ML IJ SOLN: 4.0000 mg | Freq: Four times a day (QID) | INTRAMUSCULAR | Status: DC | PRN

## 2017-09-01 MED ORDER — OXYTOCIN 10 UNIT/ML IJ SOLN
INTRAMUSCULAR | Status: AC
  Administered 2017-09-01: 10 [IU]
  Filled 2017-09-01: qty 1

## 2017-09-01 MED ORDER — DIBUCAINE 1 % RE OINT: 1.0000 "application " | TOPICAL_OINTMENT | RECTAL | Status: DC | PRN

## 2017-09-01 MED ORDER — DIPHENHYDRAMINE HCL 50 MG/ML IJ SOLN: 12.5000 mg | INTRAMUSCULAR | Status: DC | PRN

## 2017-09-01 MED ORDER — FLUOXETINE HCL 10 MG PO CAPS
10.0000 mg | ORAL_CAPSULE | Freq: Every day | ORAL | Status: DC
  Administered 2017-09-02: 10 mg via ORAL
  Filled 2017-09-01 (×2): qty 1

## 2017-09-01 NOTE — Discharge Instructions (Signed)

## 2017-09-01 NOTE — MAU Note (Signed)
Reports ctx every 2-3 since 0830. No LOF, no vag bleeding, +FM.

## 2017-09-01 NOTE — H&P (Signed)
Brooke BondsGloria Heuskin V. is a 26 y.o. female presenting for uterine contractions. Denies ROM. OB History    Gravida Para Term Preterm AB Living   2 1 1  0 0 1   SAB TAB Ectopic Multiple Live Births   0 0 0 0 1     Past Medical History:  Diagnosis Date  . ADD (attention deficit disorder)   . ADHD (attention deficit hyperactivity disorder)   . Anxiety    sees Dr. Evelene CroonKaur   . Asthma   . Depression    sees Dr. Evelene CroonKaur   . Hx of varicella   . Migraines    sees Dr. Asa LenteElliot Lewitt  . OCD (obsessive compulsive disorder)   . Panic disorder   . Vaginal Pap smear, abnormal    Past Surgical History:  Procedure Laterality Date  . BLADDER SURGERY  2012  . COLONOSCOPY     Family History: family history includes Diabetes in her maternal grandmother; Thyroid disease in her mother. Social History:  reports that she has been smoking.  She has been smoking about 0.25 packs per day. she has never used smokeless tobacco. She reports that she drinks alcohol. She reports that she does not use drugs.     Maternal Diabetes: No Genetic Screening: Normal Maternal Ultrasounds/Referrals: Normal Fetal Ultrasounds or other Referrals:  None Maternal Substance Abuse:  No Significant Maternal Medications:  Meds include: Other: Abilifi, Prozac Significant Maternal Lab Results:  None Other Comments:  None  Review of Systems  Eyes: Negative for blurred vision.  Gastrointestinal: Negative for abdominal pain.  Neurological: Negative for headaches.   History Dilation: 5 Effacement (%): 90 Station: -2 Exam by:: dr Jarah Pember Blood pressure 114/70, pulse 93, temperature 98 F (36.7 C), temperature source Oral, resp. rate 20, last menstrual period 12/09/2016, unknown if currently breastfeeding. Exam Physical Exam  Prenatal labs: ABO, Rh: O/Positive/-- (05/03 0000) Antibody: Negative (05/03 0000) Rubella: Immune (05/03 0000) RPR: Nonreactive (05/03 0000)  HBsAg: Negative (05/03 0000)  HIV: Non-reactive (05/03 0000)   GBS:     Assessment/Plan: 26 yo G2P1 @ 38 0/7 weeks in labor Anticipate vaginal delivery   Brooke Castaneda 09/01/2017, 9:40 AM

## 2017-09-01 NOTE — Progress Notes (Signed)
Delivery Note At  a viable female was delivered via  (Presentation: ;  ).  APGAR: , ; weight  .   Placenta status: , .  Cord:  with the following complications: .  Cord pH: not done I responded when called for delivery Patient delivered SVD precipitously in bed Dr Emelda FearFerguson at delivery I arrived at about 1 minute after delivery Cord clamped and cut Placenta intact, 3 vessels  Anesthesia:  None  Episiotomy:  none Lacerations:  Small first degree anterior midline lac, not bleeding, not repaired Suture Repair: N/A Est. Blood Loss (mL):  250  Mom to postpartum.  Baby to Couplet care / Skin to Skin.  Roselle LocusJames E Rilda Bulls II 09/01/2017, 12:27 PM

## 2017-09-01 NOTE — Progress Notes (Signed)
Delivery performed by RN, pt unable to breath through contractions, pushing involuntarily.

## 2017-09-01 NOTE — MAU Note (Signed)
Contractions since 0230 every 

## 2017-09-01 NOTE — Progress Notes (Signed)
FHT cat one UC q2-3 min Cx 6/C/-2 AROM clear

## 2017-09-01 NOTE — Anesthesia Postprocedure Evaluation (Signed)
Anesthesia Post Note  Patient: Brooke BondsGloria Heuskin V.  Procedure(s) Performed: AN AD HOC LABOR EPIDURAL     Patient location during evaluation: L&D Level of consciousness: awake Pain management: pain level not controlled Respiratory status: spontaneous breathing Cardiovascular status: stable Comments: Pt completely dilated    Last Vitals:  Vitals:   09/01/17 1023 09/01/17 1145  BP: 126/65 140/83  Pulse: 82 93  Resp: 18 18  Temp: 36.8 C     Last Pain:  Vitals:   09/01/17 1130  TempSrc:   PainSc: 8    Pain Goal: Patients Stated Pain Goal: 6 (09/01/17 1057)               Juaquin Ludington JR,JOHN Susann GivensFRANKLIN

## 2017-09-01 NOTE — OB Triage Note (Signed)
I have communicated with Dr. Henderson Cloudomblin and reviewed vital signs:  Vitals:   09/01/17 0550 09/01/17 0555  BP: 122/73   Pulse: 93   Resp: 18   Temp: 98 F (36.7 C)   SpO2:  96%    Vaginal exam:  Dilation: 4 Effacement (%): 60 Station: -3 Presentation: Vertex Exam by:: ansah-mensah, rnc ,   Also reviewed contraction pattern and that non-stress test is reactive.  It has been documented that patient is contracting every 3-6 minutes with no cervical change over 1 hour not indicating active labor.  Patient denies any other complaints.  Based on this report provider has given order for discharge.  A discharge order and diagnosis entered by a provider.   Labor discharge instructions reviewed with patient.

## 2017-09-01 NOTE — Anesthesia Preprocedure Evaluation (Signed)
Anesthesia Evaluation  Patient identified by MRN, date of birth, ID band Patient awake    Reviewed: Allergy & Precautions, H&P , NPO status , Patient's Chart, lab work & pertinent test results  Airway Mallampati: II  TM Distance: >3 FB Neck ROM: full    Dental no notable dental hx. (+) Teeth Intact   Pulmonary Current Smoker,    Pulmonary exam normal breath sounds clear to auscultation       Cardiovascular negative cardio ROS Normal cardiovascular exam Rhythm:regular Rate:Normal     Neuro/Psych    GI/Hepatic negative GI ROS, Neg liver ROS,   Endo/Other  negative endocrine ROS  Renal/GU negative Renal ROS  negative genitourinary   Musculoskeletal negative musculoskeletal ROS (+)   Abdominal (+) + obese,   Peds  Hematology negative hematology ROS (+)   Anesthesia Other Findings   Reproductive/Obstetrics (+) Pregnancy                             Anesthesia Physical Anesthesia Plan  ASA: II  Anesthesia Plan: Epidural   Post-op Pain Management:    Induction:   PONV Risk Score and Plan:   Airway Management Planned:   Additional Equipment:   Intra-op Plan:   Post-operative Plan:   Informed Consent: I have reviewed the patients History and Physical, chart, labs and discussed the procedure including the risks, benefits and alternatives for the proposed anesthesia with the patient or authorized representative who has indicated his/her understanding and acceptance.     Plan Discussed with:   Anesthesia Plan Comments:         Anesthesia Quick Evaluation

## 2017-09-01 NOTE — Progress Notes (Signed)
Pt involuntarily pushing, Dr Henderson Cloudomblin notified. Baby began crowning, D. Hart RochesterLawson notified for precipitous delivery.

## 2017-09-02 LAB — CBC
HCT: 25.8 % — ABNORMAL LOW (ref 36.0–46.0)
HEMOGLOBIN: 8.5 g/dL — AB (ref 12.0–15.0)
MCH: 30.1 pg (ref 26.0–34.0)
MCHC: 32.9 g/dL (ref 30.0–36.0)
MCV: 91.5 fL (ref 78.0–100.0)
PLATELETS: 258 10*3/uL (ref 150–400)
RBC: 2.82 MIL/uL — ABNORMAL LOW (ref 3.87–5.11)
RDW: 14.6 % (ref 11.5–15.5)
WBC: 10.5 10*3/uL (ref 4.0–10.5)

## 2017-09-02 LAB — RPR: RPR Ser Ql: NONREACTIVE

## 2017-09-02 NOTE — Discharge Summary (Signed)
Obstetric Discharge Summary Reason for Admission: onset of labor Prenatal Procedures: none Intrapartum Procedures: spontaneous vaginal delivery Postpartum Procedures: none Complications-Operative and Postpartum: none Hemoglobin  Date Value Ref Range Status  09/02/2017 8.5 (L) 12.0 - 15.0 g/dL Final    Comment:    DELTA CHECK NOTED REPEATED TO VERIFY    HCT  Date Value Ref Range Status  09/02/2017 25.8 (L) 36.0 - 46.0 % Final    Physical Exam:  General: alert, cooperative and appears stated age 89Lochia: appropriate Uterine Fundus: firm Incision: NA DVT Evaluation: No evidence of DVT seen on physical exam. Negative Homan's sign. No cords or calf tenderness. No significant calf/ankle edema.  Discharge Diagnoses: Term Pregnancy-delivered  Discharge Information: Date: 09/02/2017 Activity: pelvic rest Diet: routine Medications: None Condition: stable Instructions: refer to practice specific booklet Discharge to: home   Newborn Data: Live born female  Birth Weight: 7 lb 5.3 oz (3325 g) APGAR: 8, 9  Newborn Delivery   Birth date/time:  09/01/2017 12:14:00 Delivery type:  Vaginal, Spontaneous     Home with mother.  Brooke Castaneda 09/02/2017, 9:03 AM

## 2017-09-02 NOTE — Progress Notes (Signed)
MOB was referred for history of depression/anxiety. * Referral screened out by Clinical Social Worker because none of the following criteria appear to apply: ~ History of anxiety/depression during this pregnancy, or of post-partum depression. ~ Diagnosis of anxiety and/or depression within last 3 years OR * MOB's symptoms currently being treated with medication and/or therapy. Please contact the Clinical Social Worker if needs arise, by MOB request, or if MOB scores greater than 9/yes to question 10 on Edinburgh Postpartum Depression Screen.  Brooke Castaneda, MSW, LCSW Clinical Social Work [phone] 

## 2017-09-08 ENCOUNTER — Inpatient Hospital Stay (HOSPITAL_COMMUNITY): Payer: Medicaid Other

## 2018-04-28 ENCOUNTER — Ambulatory Visit (INDEPENDENT_AMBULATORY_CARE_PROVIDER_SITE_OTHER): Payer: Medicare Other | Admitting: Family Medicine

## 2018-04-28 ENCOUNTER — Encounter: Payer: Self-pay | Admitting: Family Medicine

## 2018-04-28 VITALS — BP 108/70 | HR 80 | Temp 98.2°F | Ht 68.0 in | Wt 202.0 lb

## 2018-04-28 DIAGNOSIS — Z09 Encounter for follow-up examination after completed treatment for conditions other than malignant neoplasm: Secondary | ICD-10-CM | POA: Diagnosis not present

## 2018-04-28 DIAGNOSIS — F419 Anxiety disorder, unspecified: Secondary | ICD-10-CM | POA: Diagnosis not present

## 2018-04-28 DIAGNOSIS — L732 Hidradenitis suppurativa: Secondary | ICD-10-CM

## 2018-04-28 MED ORDER — CEPHALEXIN 500 MG PO CAPS: 500.0000 mg | ORAL_CAPSULE | Freq: Two times a day (BID) | ORAL | 0 refills | Status: DC

## 2018-04-28 NOTE — Patient Instructions (Signed)
Cephalexin tablets or capsules What is this medicine? CEPHALEXIN (sef a LEX in) is a cephalosporin antibiotic. It is used to treat certain kinds of bacterial infections It will not work for colds, flu, or other viral infections. This medicine may be used for other purposes; ask your health care provider or pharmacist if you have questions. COMMON BRAND NAME(S): Biocef, Daxbia, Keflex, Keftab What should I tell my health care provider before I take this medicine? They need to know if you have any of these conditions: -kidney disease -stomach or intestine problems, especially colitis -an unusual or allergic reaction to cephalexin, other cephalosporins, penicillins, other antibiotics, medicines, foods, dyes or preservatives -pregnant or trying to get pregnant -breast-feeding How should I use this medicine? Take this medicine by mouth with a full glass of water. Follow the directions on the prescription label. This medicine can be taken with or without food. Take your medicine at regular intervals. Do not take your medicine more often than directed. Take all of your medicine as directed even if you think you are better. Do not skip doses or stop your medicine early. Talk to your pediatrician regarding the use of this medicine in children. While this drug may be prescribed for selected conditions, precautions do apply. Overdosage: If you think you have taken too much of this medicine contact a poison control center or emergency room at once. NOTE: This medicine is only for you. Do not share this medicine with others. What if I miss a dose? If you miss a dose, take it as soon as you can. If it is almost time for your next dose, take only that dose. Do not take double or extra doses. There should be at least 4 to 6 hours between doses. What may interact with this medicine? -probenecid -some other antibiotics This list may not describe all possible interactions. Give your health care provider a list of  all the medicines, herbs, non-prescription drugs, or dietary supplements you use. Also tell them if you smoke, drink alcohol, or use illegal drugs. Some items may interact with your medicine. What should I watch for while using this medicine? Tell your doctor or health care professional if your symptoms do not begin to improve in a few days. Do not treat diarrhea with over the counter products. Contact your doctor if you have diarrhea that lasts more than 2 days or if it is severe and watery. If you have diabetes, you may get a false-positive result for sugar in your urine. Check with your doctor or health care professional. What side effects may I notice from receiving this medicine? Side effects that you should report to your doctor or health care professional as soon as possible: -allergic reactions like skin rash, itching or hives, swelling of the face, lips, or tongue -breathing problems -pain or trouble passing urine -redness, blistering, peeling or loosening of the skin, including inside the mouth -severe or watery diarrhea -unusually weak or tired -yellowing of the eyes, skin Side effects that usually do not require medical attention (report to your doctor or health care professional if they continue or are bothersome): -gas or heartburn -genital or anal irritation -headache -joint or muscle pain -nausea, vomiting This list may not describe all possible side effects. Call your doctor for medical advice about side effects. You may report side effects to FDA at 1-800-FDA-1088. Where should I keep my medicine? Keep out of the reach of children. Store at room temperature between 59 and 86 degrees F (15 and   30 degrees C). Throw away any unused medicine after the expiration date. NOTE: This sheet is a summary. It may not cover all possible information. If you have questions about this medicine, talk to your doctor, pharmacist, or health care provider.  2018 Elsevier/Gold Standard  (2008-01-05 17:09:13)  

## 2018-04-28 NOTE — Progress Notes (Signed)
Subjective:    Patient ID: Brooke Morales., female    DOB: 22-Feb-1991, 27 y.o.   MRN: [id number]   PCP: Raliegh Ip, NP  Chief Complaint  Patient presents with  . Follow-up    medicaiton    HPI  Ms. Heuskin has a past medical history of Panic Disorder, Migraines, Depression, Asthma, and ADHD. She is here today for follow up.   Current Status: Since her last office visit, she is doing well with no complaints.   She denies fevers, chills, fatigue, recent infections, weight loss, and night sweats.   She has not had any headaches, visual changes, dizziness, and falls.   No chest pain, heart palpitations, cough and shortness of breath reported.   No reports of GI problems such as nausea, vomiting, diarrhea, and constipation. She has no reports of blood in stools, dysuria and hematuria.   She wants to be referred to Psychiatry today to continue refills on Prozac and Ability. She states that she is doing well on these medications. He anxiety has improved since beginning these medications. She denies suicidal ideations, homicidal ideations, or auditory hallucinations.   She denies pain today.   Past Medical History:  Diagnosis Date  . ADD (attention deficit disorder)   . ADHD (attention deficit hyperactivity disorder)   . Anxiety    sees Dr. Evelene Croon   . Asthma   . Depression    sees Dr. Evelene Croon   . Hx of varicella   . Migraines    sees Dr. Asa Lente  . OCD (obsessive compulsive disorder)   . Panic disorder   . Vaginal Pap smear, abnormal     Family History  Problem Relation Age of Onset  . Thyroid disease Mother   . Diabetes Maternal Grandmother     Social History   Socioeconomic History  . Marital status: Single    Spouse name: Not on file  . Number of children: 0  . Years of education: Not on file  . Highest education level: Not on file  Occupational History  . Occupation: Disabled    Employer: [employer]  Social Needs  . Financial  resource strain: Not on file  . Food insecurity:    Worry: Not on file    Inability: Not on file  . Transportation needs:    Medical: Not on file    Non-medical: Not on file  Tobacco Use  . Smoking status: Current Some Day Smoker    Packs/day: 0.25  . Smokeless tobacco: Never Used  . Tobacco comment: or less  Substance and Sexual Activity  . Alcohol use: Yes    Comment: glass of wine every other day  . Drug use: No  . Sexual activity: Yes    Birth control/protection: None    Comment: last intercourse:  one month ago  Lifestyle  . Physical activity:    Days per week: Not on file    Minutes per session: Not on file  . Stress: Not on file  Relationships  . Social connections:    Talks on phone: Not on file    Gets together: Not on file    Attends religious service: Not on file    Active member of club or organization: Not on file    Attends meetings of clubs or organizations: Not on file    Relationship status: Not on file  . Intimate partner violence:    Fear of current or ex partner: Not on file    Emotionally  abused: Not on file    Physically abused: Not on file    Forced sexual activity: Not on file  Other Topics Concern  . Not on file  Social History Narrative  . Not on file   Past Surgical History:  Procedure Laterality Date  . BLADDER SURGERY  2012  . COLONOSCOPY     Immunization History  Administered Date(s) Administered  . Influenza-Unspecified 07/08/2017  . PPD Test 10/17/2011, 10/17/2012    Current Meds  Medication Sig  . ARIPiprazole (ABILIFY) 5 MG tablet Take 2.5 mg daily by mouth.  Marland Kitchen. FLUoxetine (PROZAC) 10 MG capsule Take 10 mg by mouth daily.   . [DISCONTINUED] esomeprazole (NEXIUM) 20 MG capsule Take 20 mg daily by mouth.    Allergies  Allergen Reactions  . Flagyl [Metronidazole] Diarrhea  . Toradol [Ketorolac Tromethamine] Other (See Comments)    Reaction:  Tachycardia  . Tramadol Other (See Comments)    Reaction:  Tachycardia    BP  108/70 (BP Location: Left Arm, Patient Position: Sitting, Cuff Size: Small)   Pulse 80   Temp 98.2 F (36.8 C) (Oral)   Ht 5\' 8"  (1.727 m)   Wt 202 lb (91.6 kg)   LMP 04/23/2018   SpO2 100%   BMI 30.71 kg/m   Review of Systems  Constitutional: Negative.   HENT: Negative.   Eyes: Negative.   Respiratory: Negative.   Cardiovascular: Negative.   Gastrointestinal: Negative.   Endocrine: Negative.   Genitourinary: Negative.   Musculoskeletal: Negative.   Skin:       Hidradenitis Suppurativa infection located in Axilla area and in between breasts.   Allergic/Immunologic: Negative.   Neurological: Negative.   Hematological: Negative.   Psychiatric/Behavioral: Negative.    Objective:   Physical Exam  Constitutional: She is oriented to person, place, and time. She appears well-developed and well-nourished.  HENT:  Head: Normocephalic and atraumatic.  Right Ear: External ear normal.  Left Ear: External ear normal.  Nose: Nose normal.  Mouth/Throat: Oropharynx is clear and moist.  Eyes: Pupils are equal, round, and reactive to light. Conjunctivae and EOM are normal.  Neck: Normal range of motion. Neck supple.  Cardiovascular: Normal rate, regular rhythm, normal heart sounds and intact distal pulses.  Pulmonary/Chest: Effort normal and breath sounds normal.  Abdominal: Soft. Bowel sounds are normal.  Musculoskeletal: Normal range of motion.  Neurological: She is alert and oriented to person, place, and time.  Skin: Skin is warm. Capillary refill takes less than 2 seconds.  Hidradenitis Suppurativa located in Axilla area and in between breasts.   Psychiatric: She has a normal mood and affect. Her behavior is normal. Judgment and thought content normal.  Nursing note and vitals reviewed.  Assessment & Plan:   1. Anxiety She is doing well on Prozac and Abilify, which was previously prescribed by her OB/GYN. We will refer her to Psychiatry today.  - Ambulatory referral to  Psychiatry  2. Hidradenitis suppurativa Skin infection in Axilla and in between breasts. We will send Rx for Keflex to pharmacy today.  - cephALEXin (KEFLEX) 500 MG capsule; Take 1 capsule (500 mg total) by mouth 2 (two) times daily.  Dispense: 14 capsule; Refill: 0  3. Follow up She will follow up in 2 weeks for labs only. She will follow up 6 months for office visit.   Current Meds  Medication Sig  . ARIPiprazole (ABILIFY) 5 MG tablet Take 2.5 mg daily by mouth.  Marland Kitchen. FLUoxetine (PROZAC) 10 MG capsule Take 10  mg by mouth daily.   . [DISCONTINUED] esomeprazole (NEXIUM) 20 MG capsule Take 20 mg daily by mouth.    Raliegh Ip,  MSN, FNP-C [facility] [address] [place], [place] [address] [phone]

## 2018-04-30 DIAGNOSIS — M25562 Pain in left knee: Secondary | ICD-10-CM | POA: Insufficient documentation

## 2018-07-21 ENCOUNTER — Encounter (HOSPITAL_COMMUNITY): Payer: Self-pay | Admitting: Psychiatry

## 2018-07-21 ENCOUNTER — Other Ambulatory Visit (HOSPITAL_COMMUNITY): Payer: Self-pay | Admitting: Psychiatry

## 2018-07-21 ENCOUNTER — Ambulatory Visit (INDEPENDENT_AMBULATORY_CARE_PROVIDER_SITE_OTHER): Payer: Medicare Other | Admitting: Psychiatry

## 2018-07-21 VITALS — BP 124/76 | HR 74 | Ht 68.0 in | Wt 197.0 lb

## 2018-07-21 DIAGNOSIS — F330 Major depressive disorder, recurrent, mild: Secondary | ICD-10-CM | POA: Diagnosis not present

## 2018-07-21 DIAGNOSIS — Z79899 Other long term (current) drug therapy: Secondary | ICD-10-CM | POA: Diagnosis not present

## 2018-07-21 DIAGNOSIS — F17210 Nicotine dependence, cigarettes, uncomplicated: Secondary | ICD-10-CM

## 2018-07-21 DIAGNOSIS — F411 Generalized anxiety disorder: Secondary | ICD-10-CM

## 2018-07-21 MED ORDER — BUPROPION HCL ER (XL) 150 MG PO TB24: 150.0000 mg | ORAL_TABLET | Freq: Every day | ORAL | 0 refills | Status: DC

## 2018-07-21 MED ORDER — ARIPIPRAZOLE 5 MG PO TABS: 2.5000 mg | ORAL_TABLET | Freq: Every day | ORAL | 0 refills | Status: DC

## 2018-07-21 MED ORDER — FLUOXETINE HCL 10 MG PO CAPS: 10.0000 mg | ORAL_CAPSULE | Freq: Every day | ORAL | 0 refills | Status: DC

## 2018-07-21 NOTE — Progress Notes (Signed)
Psychiatric Initial Adult Assessment   Patient Identification: Brooke Castaneda MRN:  [id number] Date of Evaluation:  07/21/2018   Referral Source: Primary care physician.  Chief Complaint:  I have no energy.  I am tired.  Visit Diagnosis:    ICD-10-CM   1. GAD (generalized anxiety disorder) F41.1 ARIPiprazole (ABILIFY) 5 MG tablet  2. Mild episode of recurrent major depressive disorder (HCC) F33.0 FLUoxetine (PROZAC) 10 MG capsule    buPROPion (WELLBUTRIN XL) 150 MG 24 hr tablet    History of Present Illness: Brooke Castaneda is a 27 year old Caucasian part-time employed female who is referred from her physician for the management of depression and anxiety symptoms.  She is been taking Abilify for 2 and half years and Prozac for more than 5 years.  She was seeing psychiatrist at [facility] but she was not happy with the provider I decided to find a new psychiatrist.  Patient overall very stable on her current medication.  She told that she has a history of severe anxiety depression which she suffers since her early teens.  She had tried multiple medication but since taking the Abilify and Prozac combination her depression anxiety is much better.  However she noticed some time fatigue, lack of energy, lack of focus, concentration and not able to do multitasking.  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  She need to prepare everything for him.  She feels sometimes so tired that she is unable to do anything when she gets home.  She was given the diagnosis of ADD in her teens and she was prescribed Adderall however when she got pregnant first time, her stimulant was discontinued.  She never went back to stimulant.  Patient has a 9-year-old and 22-month-old child.  She lives with her kids and boyfriend.  She has been in a relationship with a boyfriend for more than 4 years.  She admitted there has been some  issues in the past but now couple is going to a counseling and things are much improved.  Patient sleeps better.  She denies any paranoia, hallucination, suicidal thoughts or any homicidal thoughts.  She denies any OCD symptoms, severe panic attack and crying spells.  She wants to continue Abilify and Prozac but wondering if something can be done to help her focus and energy and fatigue.  Patient has a history of asthma, migraine headache, knee pain and recently started physical therapy.  Patient denies drinking or using any illegal substances.  Her appetite is okay.  Her energy level is fair.  Associated Signs/Symptoms: Depression Symptoms:  fatigue, difficulty concentrating, anxiety, loss of energy/fatigue, (Hypo) Manic Symptoms:  Distractibility, Anxiety Symptoms:  Excessive Worry, Psychotic Symptoms:  No psychotic symptoms. PTSD Symptoms: Negative  Past Psychiatric History: Patient denies any history of psychiatric inpatient treatment or any suicidal attempt.  She had a history of severe anxiety and depression since her teens.  She was also diagnosed ADD at the same time.  In the past she has seen Dr.Rupinder Evelene Croon.  She has taken Lexapro, Xanax, Cymbalta, Zoloft and Adderall.  She did not like Zoloft because it makes her very nauseous.  She never had psychological testing however she remember screening tests done by primary care physician for ADD.  Previous Psychotropic Medications: Yes   Substance Abuse History in the last 12 months:  No.  Consequences of Substance Abuse: Negative  Past Medical History:  Past Medical  History:  Diagnosis Date  . ADD (attention deficit disorder)   . ADHD (attention deficit hyperactivity disorder)   . Anxiety    sees Dr. Evelene Croon   . Asthma   . Depression    sees Dr. Evelene Croon   . Hx of varicella   . Migraines    sees Dr. Asa Lente  . OCD (obsessive compulsive disorder)   . Panic disorder   . Vaginal Pap smear, abnormal     Past Surgical History:   Procedure Laterality Date  . BLADDER SURGERY  2012  . COLONOSCOPY      Family Psychiatric History: Mother has depression.  Family History:  Family History  Problem Relation Age of Onset  . Thyroid disease Mother   . Diabetes Maternal Grandmother     Social History:   Social History   Socioeconomic History  . Marital status: Single    Spouse name: Not on file  . Number of children: 0  . Years of education: Not on file  . Highest education level: Not on file  Occupational History  . Occupation: Disabled    Employer: [employer]  Social Needs  . Financial resource strain: Not hard at all  . Food insecurity:    Worry: Never true    Inability: Never true  . Transportation needs:    Medical: No    Non-medical: No  Tobacco Use  . Smoking status: Current Some Day Smoker    Packs/day: 0.25  . Smokeless tobacco: Never Used  . Tobacco comment: or less  Substance and Sexual Activity  . Alcohol use: Yes    Comment: glass of wine every other day  . Drug use: No  . Sexual activity: Yes    Birth control/protection: None    Comment: last intercourse:  one month ago  Lifestyle  . Physical activity:    Days per week: 3 days    Minutes per session: 60 min  . Stress: Rather much  Relationships  . Social connections:    Talks on phone: More than three times a week    Gets together: More than three times a week    Attends religious service: Never    Active member of club or organization: No    Attends meetings of clubs or organizations: Never    Relationship status: Never married  Other Topics Concern  . Not on file  Social History Narrative  . Not on file    Additional Social History: Patient born in [place].  At age 27 moved to [place].  Patient was raised by her mother.  She was never close to her father.  Patient is single and currently in a relationship with the boyfriend for more than 4 years.  She has 46-year-old and a 58-month-old  child.  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 Flagyl [Metronidazole] Diarrhea  . Toradol [Ketorolac Tromethamine] Other (See Comments)    Reaction:  Tachycardia  . Tramadol Other (See Comments)    Reaction:  Tachycardia    Metabolic Disorder Labs: No results found for: HGBA1C, MPG No results found for: PROLACTIN Lab Results  Component Value Date   CHOL 172 11/10/2012   TRIG 106.0 11/10/2012   HDL 58.80 11/10/2012   CHOLHDL 3 11/10/2012   VLDL 21.2 11/10/2012   LDLCALC 92 11/10/2012     Current Medications: Current Outpatient Medications  Medication Sig Dispense Refill  .  ARIPiprazole (ABILIFY) 5 MG tablet Take 0.5 tablets (2.5 mg total) by mouth daily. 90 tablet 0  . FLUoxetine (PROZAC) 10 MG capsule Take 1 capsule (10 mg total) by mouth daily. 90 capsule 0  . albuterol (PROVENTIL HFA;VENTOLIN HFA) 108 (90 BASE) MCG/ACT inhaler Inhale 2 puffs into the lungs every 4 (four) hours as needed for wheezing. (Patient not taking: Reported on 04/28/2018) 1 Inhaler 11  . buPROPion (WELLBUTRIN XL) 150 MG 24 hr tablet Take 1 tablet (150 mg total) by mouth daily. 30 tablet 0   No current facility-administered medications for this visit.     Neurologic: Headache: No Seizure: No Paresthesias:No  Musculoskeletal: Strength & Muscle Tone: within normal limits Gait & Station: normal Patient leans: N/A  Psychiatric Specialty Exam: ROS  Blood pressure 124/76, pulse 74, height 5\' 8"  (1.727 m), weight 197 lb (89.4 kg), unknown if currently breastfeeding.Body mass index is 29.95 kg/m.  General Appearance: Casual  Eye Contact:  Good  Speech:  Clear and Coherent  Volume:  Normal  Mood:  Anxious  Affect:  Appropriate  Thought Process:  Goal Directed  Orientation:  Full (Time, Place, and Person)  Thought Content:  Logical  Suicidal Thoughts:  No  Homicidal Thoughts:  No  Memory:  Immediate;   Good Recent;   Good Remote;    Good  Judgement:  Good  Insight:  Good  Psychomotor Activity:  Normal  Concentration:  Concentration: Fair and Attention Span: Fair  Recall:  Good  Fund of Knowledge:Good  Language: Good  Akathisia:  No  Handed:  Right  AIMS (if indicated):  0  Assets:  Communication Skills Desire for Improvement Housing Resilience Social Support Transportation  ADL's:  Intact  Cognition: WNL  Sleep: Fair    Treatment Plan Summary: Rudean is 27 year old Caucasian female with a history of anxiety, depression and ADD.  She is taking Abilify 5 mg and Prozac 10 mg which is helping her anxiety and depression however she feel fatigue, lack of energy and difficulty concentration.  I review her symptoms, history, current medication and blood work results which was done last year.  She like to continue her current Abilify and Prozac.  I will continue Abilify 5 mg daily and Prozac 10 mg at bedtime.  In the past she had tried higher dose of Prozac but it make her nauseous.  Recommended to try Wellbutrin XL 150 mg daily to help her focus and attention.  We discussed stimulant may cause worsening of anxiety and nervousness.  She is willing to try Wellbutrin.  I encouraged to continue couple counseling which she is doing every month with her boyfriend.  We discussed antipsychotic side effects including weight gain, EPS and metabolic syndrome.  We will do blood work including TSH and hemoglobin A1c.  Recommended to call us back if he has any question, concern if she feels worsening of the symptoms.  Discussed safety concerns at any time having active suicidal thoughts or homicidal thought that she need to call 911 or go to local emergency room.  I will see her again in 4 weeks.   Cleotis Nipper, MD 10/7/20191:35 PM

## 2018-07-22 LAB — CBC WITH DIFFERENTIAL/PLATELET
Basophils Absolute: 0 10*3/uL (ref 0.0–0.2)
Basos: 0 %
EOS (ABSOLUTE): 0.1 10*3/uL (ref 0.0–0.4)
Eos: 2 %
Hematocrit: 44.1 % (ref 34.0–46.6)
Hemoglobin: 14.6 g/dL (ref 11.1–15.9)
Immature Grans (Abs): 0 10*3/uL (ref 0.0–0.1)
Immature Granulocytes: 0 %
Lymphocytes Absolute: 1.6 10*3/uL (ref 0.7–3.1)
Lymphs: 24 %
MCH: 28.6 pg (ref 26.6–33.0)
MCHC: 33.1 g/dL (ref 31.5–35.7)
MCV: 86 fL (ref 79–97)
Monocytes Absolute: 0.4 10*3/uL (ref 0.1–0.9)
Monocytes: 6 %
Neutrophils Absolute: 4.4 10*3/uL (ref 1.4–7.0)
Neutrophils: 68 %
Platelets: 303 10*3/uL (ref 150–450)
RBC: 5.11 x10E6/uL (ref 3.77–5.28)
RDW: 14.1 % (ref 12.3–15.4)
WBC: 6.5 10*3/uL (ref 3.4–10.8)

## 2018-07-22 LAB — COMPREHENSIVE METABOLIC PANEL
ALT: 17 IU/L (ref 0–32)
AST: 22 IU/L (ref 0–40)
Albumin/Globulin Ratio: 1.6 (ref 1.2–2.2)
Albumin: 4.4 g/dL (ref 3.5–5.5)
Alkaline Phosphatase: 92 IU/L (ref 39–117)
BUN/Creatinine Ratio: 9 (ref 9–23)
BUN: 7 mg/dL (ref 6–20)
Bilirubin Total: 0.3 mg/dL (ref 0.0–1.2)
CO2: 24 mmol/L (ref 20–29)
Calcium: 9.3 mg/dL (ref 8.7–10.2)
Chloride: 101 mmol/L (ref 96–106)
Creatinine, Ser: 0.78 mg/dL (ref 0.57–1.00)
GFR calc Af Amer: 121 mL/min/{1.73_m2} (ref >59)
GFR calc non Af Amer: 105 mL/min/{1.73_m2} (ref >59)
Globulin, Total: 2.7 g/dL (ref 1.5–4.5)
Glucose: 75 mg/dL (ref 65–99)
Potassium: 4.9 mmol/L (ref 3.5–5.2)
Sodium: 139 mmol/L (ref 134–144)
Total Protein: 7.1 g/dL (ref 6.0–8.5)

## 2018-07-22 LAB — HEMOGLOBIN A1C
Est. average glucose Bld gHb Est-mCnc: 100 mg/dL
Hgb A1c MFr Bld: 5.1 % (ref 4.8–5.6)

## 2018-07-22 LAB — TSH: TSH: 1.36 u[IU]/mL (ref 0.450–4.500)

## 2018-08-20 ENCOUNTER — Ambulatory Visit (HOSPITAL_COMMUNITY): Payer: Medicare Other | Admitting: Psychiatry

## 2018-09-22 ENCOUNTER — Other Ambulatory Visit (HOSPITAL_COMMUNITY): Payer: Self-pay | Admitting: Psychiatry

## 2018-09-22 DIAGNOSIS — F411 Generalized anxiety disorder: Secondary | ICD-10-CM

## 2018-10-02 ENCOUNTER — Telehealth (HOSPITAL_COMMUNITY): Payer: Self-pay

## 2018-10-02 NOTE — Telephone Encounter (Signed)
Yes please provide samples until she had appointment.

## 2018-10-02 NOTE — Telephone Encounter (Signed)
Patient called and left a voicemail that she has been out of Abilify for 3 days, she has an appointment tomorrow. Do you want me to send in enough for today and tomorrow or just send a 30 day?

## 2018-10-03 ENCOUNTER — Ambulatory Visit (INDEPENDENT_AMBULATORY_CARE_PROVIDER_SITE_OTHER): Payer: Medicare Other | Admitting: Psychiatry

## 2018-10-03 ENCOUNTER — Encounter

## 2018-10-03 ENCOUNTER — Encounter (HOSPITAL_COMMUNITY): Payer: Self-pay | Admitting: Psychiatry

## 2018-10-03 DIAGNOSIS — F330 Major depressive disorder, recurrent, mild: Secondary | ICD-10-CM | POA: Diagnosis not present

## 2018-10-03 DIAGNOSIS — F411 Generalized anxiety disorder: Secondary | ICD-10-CM

## 2018-10-03 MED ORDER — FLUOXETINE HCL 10 MG PO CAPS: 10.0000 mg | ORAL_CAPSULE | Freq: Every day | ORAL | 0 refills | Status: DC

## 2018-10-03 MED ORDER — ARIPIPRAZOLE 5 MG PO TABS: 5.0000 mg | ORAL_TABLET | Freq: Every day | ORAL | 0 refills | Status: DC

## 2018-10-03 NOTE — Progress Notes (Signed)
BH MD/PA/NP OP Progress Note  10/03/2018 10:38 AM Lysle MoralesGloria Castaneda V.  MRN:  [id number]  Chief Complaint: I missed my appointment.  I am out of Abilify and I am feeling very nervous anxious and have increased anxiety.  HPI: Brooke BondsGloria came for her follow-up appointment.  She is a 27 year old Caucasian recently married female who was seen first time in October.  She has a history of depression.  She was getting treatment at neuropsychiatry center but she was not happy with the care.  She apologized missing her appointment.  She is taking Prozac 10 mg however she ran out Abilify a week ago and she is experiencing increased anxiety, nervousness, depression.  We have tried Wellbutrin to help her focus, energy and concentration however after taking for 1 month she did not see any improvement and is stopped.  She still have fatigue, lack of motivation and difficulty multitasking but she is scared to try any other medication.  In the past she had tried Lexapro, Cymbalta, Zoloft and recently Wellbutrin.  She also tried Adderall and Xanax.  She diagnosed with ADD by Dr. Lafayette Dragonarr however no psychological testing done.  Patient got married last Sunday.  She also very sad because her client Brooke Castaneda who is also a patient in our office died on November 7 due to natural causes.  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  She admitted having crying spells and going through grief.  She is trying to do look forward for Christmas.  Patient has a 211-month-old and 27-year-old child.  Since hence passed away she is no longer working.  She wants to spend more time with the family.  She is pleased that her husband is very supportive.  Patient denies any paranoia, hallucination or any suicidal thoughts.  She denies any mania, psychosis or any hallucination.  Her energy level is low.  Visit Diagnosis:    ICD-10-CM   1. Mild episode of recurrent major depressive disorder (HCC) F33.0 FLUoxetine (PROZAC) 10 MG capsule  2. GAD  (generalized anxiety disorder) F41.1 ARIPiprazole (ABILIFY) 5 MG tablet    Past Psychiatric History: Reviewed. Stay of psychiatric inpatient treatment or any suicidal attempt.  History of severe anxiety and depression since teens.  Diagnosed with ADD but no formal psychological testing.  Prescribed Adderall and Xanax from Dr. Lafayette Dragonarr.  She also [facility] but not happy with the care.  Tried higher dose of Prozac but make her very nervous and anxious.  We have tried Wellbutrin but no improvement.    Past Medical History:  Past Medical History:  Diagnosis Date  . ADD (attention deficit disorder)   . ADHD (attention deficit hyperactivity disorder)   . Anxiety    sees Dr. Evelene CroonKaur   . Asthma   . Depression    sees Dr. Evelene CroonKaur   . Hx of varicella   . Migraines    sees Dr. Asa LenteElliot Lewitt  . OCD (obsessive compulsive disorder)   . Panic disorder   . Vaginal Pap smear, abnormal     Past Surgical History:  Procedure Laterality Date  . BLADDER SURGERY  2012  . COLONOSCOPY      Family Psychiatric History: Reviewed.  Family History:  Family History  Problem Relation Age of Onset  . Thyroid disease Mother   . Diabetes Maternal Grandmother     Social History:  Social History   Socioeconomic History  . Marital status: Single    Spouse name: Not on file  .  Number of children: 0  . Years of education: Not on file  . Highest education level: Not on file  Occupational History  . Occupation: Disabled    Employer: [employer]  Social Needs  . Financial resource strain: Not hard at all  . Food insecurity:    Worry: Never true    Inability: Never true  . Transportation needs:    Medical: No    Non-medical: No  Tobacco Use  . Smoking status: Current Some Day Smoker    Packs/day: 0.25  . Smokeless tobacco: Never Used  . Tobacco comment: or less  Substance and Sexual Activity  . Alcohol use: Yes    Comment: glass of wine every other day   . Drug use: No  . Sexual activity: Yes    Birth control/protection: None    Comment: last intercourse:  one month ago  Lifestyle  . Physical activity:    Days per week: 3 days    Minutes per session: 60 min  . Stress: Rather much  Relationships  . Social connections:    Talks on phone: More than three times a week    Gets together: More than three times a week    Attends religious service: Never    Active member of club or organization: No    Attends meetings of clubs or organizations: Never    Relationship status: Never married  Other Topics Concern  . Not on file  Social History Narrative  . Not on file    Allergies:  Allergies  Allergen Reactions  . Flagyl [Metronidazole] Diarrhea  . Toradol [Ketorolac Tromethamine] Other (See Comments)    Reaction:  Tachycardia  . Tramadol Other (See Comments)    Reaction:  Tachycardia    Metabolic Disorder Labs: Lab Results  Component Value Date   HGBA1C 5.1 07/21/2018   No results found for: PROLACTIN Lab Results  Component Value Date   CHOL 172 11/10/2012   TRIG 106.0 11/10/2012   HDL 58.80 11/10/2012   CHOLHDL 3 11/10/2012   VLDL 21.2 11/10/2012   LDLCALC 92 11/10/2012   Lab Results  Component Value Date   TSH 1.360 07/21/2018   TSH 1.69 11/10/2012    Therapeutic Level Labs: No results found for: LITHIUM No results found for: VALPROATE No components found for:  CBMZ  Current Medications: Current Outpatient Medications  Medication Sig Dispense Refill  . ARIPiprazole (ABILIFY) 5 MG tablet Take 0.5 tablets (2.5 mg total) by mouth daily. 90 tablet 0  . FLUoxetine (PROZAC) 10 MG capsule Take 1 capsule (10 mg total) by mouth daily. 90 capsule 0  . albuterol (PROVENTIL HFA;VENTOLIN HFA) 108 (90 BASE) MCG/ACT inhaler Inhale 2 puffs into the lungs every 4 (four) hours as needed for wheezing. (Patient not taking: Reported on 04/28/2018) 1 Inhaler 11  . buPROPion (WELLBUTRIN XL) 150 MG 24 hr tablet Take 1 tablet (150 mg  total) by mouth daily. (Patient not taking: Reported on 10/03/2018) 30 tablet 0   No current facility-administered medications for this visit.      Musculoskeletal: Strength & Muscle Tone: within normal limits Gait & Station: normal Patient leans: N/A  Psychiatric Specialty Exam: Review of Systems  HENT: Negative.   Respiratory: Negative.   Musculoskeletal: Negative.   Skin: Negative.   Psychiatric/Behavioral: Positive for depression. The patient is nervous/anxious.     Blood pressure 120/70, height 5\' 8"  (1.727 m), weight 192 lb (87.1 kg), unknown if currently breastfeeding.Body mass index is 29.19 kg/m.  General  Appearance: Casual  Eye Contact:  Good  Speech:  Normal Rate  Volume:  Normal  Mood:  Anxious and Dysphoric  Affect:  Constricted  Thought Process:  Goal Directed  Orientation:  Full (Time, Place, and Person)  Thought Content: Grief about Brooke Castaneda death.   Suicidal Thoughts:  No  Homicidal Thoughts:  No  Memory:  Immediate;   Good Recent;   Good Remote;   Good  Judgement:  Good  Insight:  Good  Psychomotor Activity:  Normal  Concentration:  Concentration: Fair and Attention Span: Fair  Recall:  Good  Fund of Knowledge: Good  Language: Good  Akathisia:  No  Handed:  Right  AIMS (if indicated): not done  Assets:  Communication Skills Desire for Improvement Housing Resilience Social Support  ADL's:  Intact  Cognition: WNL  Sleep:  Fair   Screenings: PHQ2-9     [facility] Visit from 04/28/2018 in [facility] Office Visit [facility]/2016 [facility]  PHQ-2 Total Score  0  0       Assessment and Plan: Major depressive disorder, recurrent.  Generalized anxiety disorder.  Rule out ADD.  Discussed risk of relapse due to noncompliance with medication.  Reminded to call us if she is running low on medication.  Restart Abilify 5 mg daily which was helping her.  Discontinue Wellbutrin since it did not work her focus and  attention.  Continue Prozac 10 mg daily.  In the past she took higher dose of Prozac but it make her more nervous and anxious.  She had tried multiple SSRIs in the past and she is reluctant to try any new medication.  I recommended she should do gene site testing which may help her to choose the medication in the future.  If her attention concentration and multitasking remains an issue then we will consider psychological testing.  Patient do not recall side effects from Abilify and Prozac.  We discussed grief counseling but she does not feel therapy at this time.  We discussed safety concerns at any time having active suicidal thoughts or homicidal thought and she need to call 911 or go to local emergency room.  I will see her again in 6 weeks. Time spent 25 minutes.  More than 50% of the time spent in psychoeducation, counseling and coordination of care.  Discuss safety plan that anytime having active suicidal thoughts or homicidal thoughts then patient need to call 911 or go to the local emergency room.      Cleotis NipperSyed T Jemario Poitras, MD 10/03/2018, 10:38 AM

## 2018-10-29 ENCOUNTER — Ambulatory Visit: Payer: Self-pay | Admitting: Family Medicine

## 2018-11-15 ENCOUNTER — Ambulatory Visit (INDEPENDENT_AMBULATORY_CARE_PROVIDER_SITE_OTHER): Payer: Medicare Other | Admitting: Psychiatry

## 2018-11-15 ENCOUNTER — Encounter (HOSPITAL_COMMUNITY): Payer: Self-pay | Admitting: Psychiatry

## 2018-11-15 VITALS — BP 118/68 | Ht 68.0 in | Wt 194.0 lb

## 2018-11-15 DIAGNOSIS — F411 Generalized anxiety disorder: Secondary | ICD-10-CM | POA: Diagnosis not present

## 2018-11-15 DIAGNOSIS — F330 Major depressive disorder, recurrent, mild: Secondary | ICD-10-CM | POA: Diagnosis not present

## 2018-11-15 DIAGNOSIS — F900 Attention-deficit hyperactivity disorder, predominantly inattentive type: Secondary | ICD-10-CM | POA: Diagnosis not present

## 2018-11-15 MED ORDER — ARIPIPRAZOLE 5 MG PO TABS: 5.0000 mg | ORAL_TABLET | Freq: Every day | ORAL | 0 refills | Status: DC

## 2018-11-15 MED ORDER — LISDEXAMFETAMINE DIMESYLATE 30 MG PO CAPS: 30.0000 mg | ORAL_CAPSULE | Freq: Every day | ORAL | 0 refills | Status: DC

## 2018-11-15 MED ORDER — FLUOXETINE HCL 10 MG PO CAPS: 10.0000 mg | ORAL_CAPSULE | Freq: Every day | ORAL | 0 refills | Status: DC

## 2018-11-15 NOTE — Progress Notes (Signed)
BH MD/PA/NP OP Progress Note  11/15/2018 12:29 PM Langston Kelter  MRN:  [id number]  Chief Complaint: My depression is better but I still struggle with my attention and focus.  I have difficulty doing multitasking.  HPI: Robertine came for her follow-up appointment.  She is taking Abilify 5 mg and Prozac 10 mg.  She noticed her depression is better but she continues to struggle with attention, focus and multitasking.  She had ADD and prescribed Adderall and Ritalin but she stopped taking it.  She never had psychological testing.  She feels otherwise things are going well.  Her husband who she recently married the supportive.  Patient married last month on Sunday but now she is planning to have a official wedding ceremony in September.  Patient denies any irritability, anger, mania or any psychosis.  Her crying spells are less intense and less frequent.  Patient lives with her 76-month-old and 3-year-old child.  She is still grief about her previous Graciela Husbands who died few months ago.  Patient denies drinking or using any illegal substances.  She like to try stimulant again to help her attention focus and multitasking.    Visit Diagnosis:    ICD-10-CM   1. Attention deficit hyperactivity disorder (ADHD), predominantly inattentive type F90.0 lisdexamfetamine (VYVANSE) 30 MG capsule  2. Mild episode of recurrent major depressive disorder (HCC) F33.0 FLUoxetine (PROZAC) 10 MG capsule  3. GAD (generalized anxiety disorder) F41.1 ARIPiprazole (ABILIFY) 5 MG tablet    Past Psychiatric History: Reviewed. No H/O psychiatric inpatient treatment or any suicidal attempt.  History of severe anxiety and depression since teens.  Diagnosed with ADD but no formal psychological testing.  Prescribed Adderall and Xanax from Dr. Lafayette Dragon.  She also took Zoloft, Cymbalta from [facility] but not happy with the care.  Tried higher dose of Prozac but make her very nervous and anxious.  We have tried Wellbutrin but no  improvement.   Past Medical History:  Past Medical History:  Diagnosis Date  . ADD (attention deficit disorder)   . ADHD (attention deficit hyperactivity disorder)   . Anxiety    sees Dr. Evelene Croon   . Asthma   . Depression    sees Dr. Evelene Croon   . Hx of varicella   . Migraines    sees Dr. Asa Lente  . OCD (obsessive compulsive disorder)   . Panic disorder   . Vaginal Pap smear, abnormal     Past Surgical History:  Procedure Laterality Date  . BLADDER SURGERY  2012  . COLONOSCOPY      Family Psychiatric History: Reviewed.  Family History:  Family History  Problem Relation Age of Onset  . Thyroid disease Mother   . Diabetes Maternal Grandmother     Social History:  Social History   Socioeconomic History  . Marital status: Single    Spouse name: Not on file  . Number of children: 0  . Years of education: Not on file  . Highest education level: Not on file  Occupational History  . Occupation: Disabled    Employer: [employer]  Social Needs  . Financial resource strain: Not hard at all  . Food insecurity:    Worry: Never true    Inability: Never true  . Transportation needs:    Medical: No    Non-medical: No  Tobacco Use  . Smoking status: Current Some Day Smoker    Packs/day: 0.25  . Smokeless tobacco: Never Used  . Tobacco comment: or less  Substance and Sexual Activity  . Alcohol use: Yes    Comment: glass of wine every other day  . Drug use: No  . Sexual activity: Yes    Birth control/protection: None    Comment: last intercourse:  one month ago  Lifestyle  . Physical activity:    Days per week: 3 days    Minutes per session: 60 min  . Stress: Rather much  Relationships  . Social connections:    Talks on phone: More than three times a week    Gets together: More than three times a week    Attends religious service: Never    Active member of club or organization: No    Attends meetings of clubs or organizations: Never    Relationship  status: Never married  Other Topics Concern  . Not on file  Social History Narrative  . Not on file    Allergies:  Allergies  Allergen Reactions  . Flagyl [Metronidazole] Diarrhea  . Toradol [Ketorolac Tromethamine] Other (See Comments)    Reaction:  Tachycardia  . Tramadol Other (See Comments)    Reaction:  Tachycardia    Metabolic Disorder Labs: Lab Results  Component Value Date   HGBA1C 5.1 07/21/2018   No results found for: PROLACTIN Lab Results  Component Value Date   CHOL 172 11/10/2012   TRIG 106.0 11/10/2012   HDL 58.80 11/10/2012   CHOLHDL 3 11/10/2012   VLDL 21.2 11/10/2012   LDLCALC 92 11/10/2012   Lab Results  Component Value Date   TSH 1.360 07/21/2018   TSH 1.69 11/10/2012    Therapeutic Level Labs: No results found for: LITHIUM No results found for: VALPROATE No components found for:  CBMZ  Current Medications: Current Outpatient Medications  Medication Sig Dispense Refill  . ARIPiprazole (ABILIFY) 5 MG tablet Take 1 tablet (5 mg total) by mouth daily. 90 tablet 0  . FLUoxetine (PROZAC) 10 MG capsule Take 1 capsule (10 mg total) by mouth daily. 90 capsule 0   No current facility-administered medications for this visit.      Musculoskeletal: Strength & Muscle Tone: within normal limits Gait & Station: normal Patient leans: N/A  Psychiatric Specialty Exam: Review of Systems  Constitutional: Positive for malaise/fatigue.  Respiratory: Negative.   Skin: Negative.   Neurological: Negative.   Psychiatric/Behavioral: The patient is nervous/anxious.     Blood pressure 118/68, height 5\' 8"  (1.727 m), weight 194 lb (88 kg), unknown if currently breastfeeding.Body mass index is 29.5 kg/m.  General Appearance: Casual and tired  Eye Contact:  Fair  Speech:  Slow  Volume:  Decreased  Mood:  tired  Affect:  Restricted  Thought Process:  Goal Directed  Orientation:  Full (Time, Place, and Person)  Thought Content: Rumination   Suicidal  Thoughts:  No  Homicidal Thoughts:  No  Memory:  Immediate;   Good Recent;   Good Remote;   Good  Judgement:  Good  Insight:  Good  Psychomotor Activity:  Decreased  Concentration:  Concentration: Fair and Attention Span: Fair  Recall:  Good  Fund of Knowledge: Good  Language: Good  Akathisia:  No  Handed:  Right  AIMS (if indicated): not done  Assets:  Communication Skills Desire for Improvement Housing Resilience  ADL's:  Intact  Cognition: WNL  Sleep:  Fair   Screenings: PHQ2-9     [facility] Visit [facility]/28/2018 in [facility] Office Visit from 05/24/2016 in [facility]  PHQ-2 Total  Score  0  0       Assessment and Plan: Major depressive disorder, recurrent.  Generalized anxiety disorder.  Attention deficit disorder, inattentive type.  I reviewed her medication, psychosocial stressors.  Her mood is much better but she continues to struggle with attention, focus, fatigue, tired and multitasking.  She like to go back on ADD medication.  In the past she had tried Adderall and Ritalin.  I recommended to try Vyvanse 30 mg to help focus and attention.  If patient do not see any improvement then we will consider psychological testing before we go up the dose.  I also encouraged to see a therapist for coping skills.  Discussed medication side effect specially stimulant can cause nausea, nervousness, palpitation, lack of appetite, weight loss and worsening of anxiety.  Encourage healthy lifestyle.  Continue Prozac 10 mg daily and Abilify 5 mg daily.  Patient had tried higher dose of Prozac but it make her more anxious.  Patient has no tremors shakes or any EPS.  I will see her again in 6 weeks. Time spent 30 minutes.  More than 50% of the time spent in psychoeducation, counseling and coordination of care.  Discuss safety plan that anytime having active suicidal thoughts or homicidal thoughts then patient need to call 911 or go to the local emergency  room.     Cleotis NipperSyed T Tommy Minichiello, MD 11/15/2018, 12:29 PM

## 2018-11-25 ENCOUNTER — Telehealth (HOSPITAL_COMMUNITY): Payer: Self-pay

## 2018-11-25 DIAGNOSIS — F900 Attention-deficit hyperactivity disorder, predominantly inattentive type: Secondary | ICD-10-CM

## 2018-11-25 MED ORDER — METHYLPHENIDATE HCL 10 MG PO TABS: 10.0000 mg | ORAL_TABLET | Freq: Every day | ORAL | 0 refills | Status: DC

## 2018-11-25 NOTE — Telephone Encounter (Addendum)
Submitted Vyvanse 30mg  for prior authorization, but it was denied.  Preferred medication is Dexmethylephenidate HCL tab, Atomoxetine HCL caps, Methylphenidate tab. Can Vyvanse be changed for one of the preferred medications?

## 2018-11-25 NOTE — Telephone Encounter (Signed)
She can try Ritalin 10 mg daily.  I will call her pharmacy at [facility].  Please inform the patient.

## 2018-11-26 NOTE — Telephone Encounter (Signed)
Called patient explained to her the change in medication, patient was okay with the change

## 2018-12-12 ENCOUNTER — Ambulatory Visit (HOSPITAL_COMMUNITY): Payer: Medicare Other | Admitting: Psychiatry

## 2018-12-30 ENCOUNTER — Telehealth (HOSPITAL_COMMUNITY): Payer: Self-pay

## 2018-12-30 DIAGNOSIS — F900 Attention-deficit hyperactivity disorder, predominantly inattentive type: Secondary | ICD-10-CM

## 2018-12-30 MED ORDER — METHYLPHENIDATE HCL 10 MG PO TABS: 10.0000 mg | ORAL_TABLET | Freq: Every day | ORAL | 0 refills | Status: DC

## 2018-12-30 NOTE — Telephone Encounter (Signed)
Prescription sent to her pharmacy

## 2018-12-30 NOTE — Telephone Encounter (Signed)
Refill request sent by pharmacy via fax for Ritalin 10 mg, please review and advise, thank you

## 2019-02-27 ENCOUNTER — Other Ambulatory Visit: Payer: Self-pay | Admitting: Family Medicine

## 2019-02-27 DIAGNOSIS — L732 Hidradenitis suppurativa: Secondary | ICD-10-CM

## 2019-05-12 ENCOUNTER — Other Ambulatory Visit (HOSPITAL_COMMUNITY): Payer: Self-pay

## 2019-05-12 DIAGNOSIS — F411 Generalized anxiety disorder: Secondary | ICD-10-CM

## 2019-05-12 MED ORDER — ARIPIPRAZOLE 5 MG PO TABS: 5.0000 mg | ORAL_TABLET | Freq: Every day | ORAL | 0 refills | Status: DC

## 2019-06-04 ENCOUNTER — Other Ambulatory Visit (HOSPITAL_COMMUNITY): Payer: Self-pay

## 2019-06-04 DIAGNOSIS — F411 Generalized anxiety disorder: Secondary | ICD-10-CM

## 2019-06-04 MED ORDER — ARIPIPRAZOLE 5 MG PO TABS: 5.0000 mg | ORAL_TABLET | Freq: Every day | ORAL | 0 refills | Status: DC

## 2019-06-09 ENCOUNTER — Other Ambulatory Visit: Payer: Self-pay

## 2019-06-09 ENCOUNTER — Ambulatory Visit (HOSPITAL_COMMUNITY): Payer: Medicare Other | Admitting: Psychiatry

## 2022-03-30 LAB — OB RESULTS CONSOLE RPR: RPR: NONREACTIVE

## 2022-03-30 LAB — OB RESULTS CONSOLE GC/CHLAMYDIA: Chlamydia: NEGATIVE

## 2022-03-30 LAB — OB RESULTS CONSOLE HIV ANTIBODY (ROUTINE TESTING): HIV: NONREACTIVE

## 2022-03-30 LAB — HEPATITIS C ANTIBODY: HCV Ab: NEGATIVE

## 2022-03-30 LAB — OB RESULTS CONSOLE HEPATITIS B SURFACE ANTIGEN: Hepatitis B Surface Ag: NEGATIVE

## 2022-03-30 LAB — OB RESULTS CONSOLE RUBELLA ANTIBODY, IGM: Rubella: IMMUNE

## 2022-04-20 ENCOUNTER — Inpatient Hospital Stay (HOSPITAL_COMMUNITY)
Admission: AD | Admit: 2022-04-20 | Payer: Medicare Other | Source: Home / Self Care | Admitting: Obstetrics & Gynecology

## 2022-10-02 ENCOUNTER — Emergency Department (HOSPITAL_BASED_OUTPATIENT_CLINIC_OR_DEPARTMENT_OTHER): Payer: Medicare Other

## 2022-10-02 ENCOUNTER — Other Ambulatory Visit: Payer: Self-pay

## 2022-10-02 ENCOUNTER — Observation Stay (HOSPITAL_BASED_OUTPATIENT_CLINIC_OR_DEPARTMENT_OTHER)
Admission: AD | Admit: 2022-10-02 | Discharge: 2022-10-03 | Disposition: A | Discharge status: Home / Self Care | Payer: Medicare Other | Attending: Obstetrics and Gynecology | Admitting: Obstetrics and Gynecology

## 2022-10-02 ENCOUNTER — Encounter (HOSPITAL_BASED_OUTPATIENT_CLINIC_OR_DEPARTMENT_OTHER): Payer: Self-pay

## 2022-10-02 DIAGNOSIS — J45909 Unspecified asthma, uncomplicated: Secondary | ICD-10-CM | POA: Diagnosis not present

## 2022-10-02 DIAGNOSIS — N12 Tubulo-interstitial nephritis, not specified as acute or chronic: Secondary | ICD-10-CM | POA: Diagnosis present

## 2022-10-02 DIAGNOSIS — O219 Vomiting of pregnancy, unspecified: Secondary | ICD-10-CM | POA: Insufficient documentation

## 2022-10-02 DIAGNOSIS — O99513 Diseases of the respiratory system complicating pregnancy, third trimester: Secondary | ICD-10-CM | POA: Insufficient documentation

## 2022-10-02 DIAGNOSIS — Z79899 Other long term (current) drug therapy: Secondary | ICD-10-CM | POA: Insufficient documentation

## 2022-10-02 DIAGNOSIS — O2303 Infections of kidney in pregnancy, third trimester: Secondary | ICD-10-CM | POA: Diagnosis not present

## 2022-10-02 DIAGNOSIS — O99343 Other mental disorders complicating pregnancy, third trimester: Secondary | ICD-10-CM | POA: Insufficient documentation

## 2022-10-02 DIAGNOSIS — Z3A34 34 weeks gestation of pregnancy: Secondary | ICD-10-CM | POA: Insufficient documentation

## 2022-10-02 DIAGNOSIS — M546 Pain in thoracic spine: Secondary | ICD-10-CM | POA: Diagnosis not present

## 2022-10-02 DIAGNOSIS — O2300 Infections of kidney in pregnancy, unspecified trimester: Secondary | ICD-10-CM | POA: Diagnosis present

## 2022-10-02 DIAGNOSIS — O99891 Other specified diseases and conditions complicating pregnancy: Principal | ICD-10-CM | POA: Insufficient documentation

## 2022-10-02 DIAGNOSIS — Z3A35 35 weeks gestation of pregnancy: Secondary | ICD-10-CM | POA: Diagnosis not present

## 2022-10-02 LAB — URINALYSIS, ROUTINE W REFLEX MICROSCOPIC
Bilirubin Urine: NEGATIVE
Bilirubin Urine: NEGATIVE
Glucose, UA: NEGATIVE mg/dL
Glucose, UA: NEGATIVE mg/dL
Hgb urine dipstick: NEGATIVE
Hgb urine dipstick: NEGATIVE
Ketones, ur: NEGATIVE mg/dL
Ketones, ur: 80 mg/dL — AB
Leukocytes,Ua: NEGATIVE
Nitrite: NEGATIVE
Nitrite: NEGATIVE
Protein, ur: 30 mg/dL — AB
Protein, ur: 30 mg/dL — AB
Specific Gravity, Urine: 1.027 (ref 1.005–1.030)
Specific Gravity, Urine: 1.023 (ref 1.005–1.030)
pH: 6 (ref 5.0–8.0)
pH: 6 (ref 5.0–8.0)

## 2022-10-02 LAB — CBC WITH DIFFERENTIAL/PLATELET
Abs Immature Granulocytes: 0.1 10*3/uL — ABNORMAL HIGH (ref 0.00–0.07)
Basophils Absolute: 0 10*3/uL (ref 0.0–0.1)
Basophils Relative: 0 %
Eosinophils Absolute: 0.1 10*3/uL (ref 0.0–0.5)
Eosinophils Relative: 1 %
HCT: 34.1 % — ABNORMAL LOW (ref 36.0–46.0)
Hemoglobin: 11.4 g/dL — ABNORMAL LOW (ref 12.0–15.0)
Immature Granulocytes: 1 %
Lymphocytes Relative: 15 %
Lymphs Abs: 2.3 10*3/uL (ref 0.7–4.0)
MCH: 29.1 pg (ref 26.0–34.0)
MCHC: 33.4 g/dL (ref 30.0–36.0)
MCV: 87 fL (ref 80.0–100.0)
Monocytes Absolute: 0.9 10*3/uL (ref 0.1–1.0)
Monocytes Relative: 6 %
Neutro Abs: 11.5 10*3/uL — ABNORMAL HIGH (ref 1.7–7.7)
Neutrophils Relative %: 77 %
Platelets: 441 10*3/uL — ABNORMAL HIGH (ref 150–400)
RBC: 3.92 MIL/uL (ref 3.87–5.11)
RDW: 13.7 % (ref 11.5–15.5)
WBC: 14.9 10*3/uL — ABNORMAL HIGH (ref 4.0–10.5)
nRBC: 0 % (ref 0.0–0.2)

## 2022-10-02 LAB — BASIC METABOLIC PANEL
Anion gap: 13 (ref 5–15)
BUN: 8 mg/dL (ref 6–20)
CO2: 20 mmol/L — ABNORMAL LOW (ref 22–32)
Calcium: 8.9 mg/dL (ref 8.9–10.3)
Chloride: 103 mmol/L (ref 98–111)
Creatinine, Ser: 0.66 mg/dL (ref 0.44–1.00)
GFR, Estimated: >60 mL/min (ref >60)
Glucose, Bld: 121 mg/dL — ABNORMAL HIGH (ref 70–99)
Potassium: 3.6 mmol/L (ref 3.5–5.1)
Sodium: 136 mmol/L (ref 135–145)

## 2022-10-02 LAB — AMYLASE: Amylase: 53 U/L (ref 28–100)

## 2022-10-02 LAB — LIPASE, BLOOD: Lipase: 27 U/L (ref 11–51)

## 2022-10-02 LAB — TYPE AND SCREEN
ABO/RH(D): O POS
Antibody Screen: NEGATIVE

## 2022-10-02 MED ORDER — PRENATAL MULTIVITAMIN CH
1.0000 | ORAL_TABLET | Freq: Every day | ORAL | Status: DC
  Administered 2022-10-02 – 2022-10-03 (×2): 1 via ORAL
  Filled 2022-10-02 (×2): qty 1

## 2022-10-02 MED ORDER — CEFAZOLIN SODIUM-DEXTROSE 1-4 GM/50ML-% IV SOLN
1.0000 g | Freq: Three times a day (TID) | INTRAVENOUS | Status: DC
  Administered 2022-10-02 – 2022-10-03 (×4): 1 g via INTRAVENOUS
  Filled 2022-10-02 (×6): qty 50

## 2022-10-02 MED ORDER — LACTATED RINGERS IV SOLN: 125.0000 mL/h | INTRAVENOUS | Status: DC

## 2022-10-02 MED ORDER — ACETAMINOPHEN 325 MG PO TABS
650.0000 mg | ORAL_TABLET | ORAL | Status: DC | PRN
  Administered 2022-10-03: 650 mg via ORAL
  Filled 2022-10-02 (×2): qty 2

## 2022-10-02 MED ORDER — LACTATED RINGERS IV SOLN: INTRAVENOUS | Status: DC

## 2022-10-02 MED ORDER — ONDANSETRON HCL 4 MG/2ML IJ SOLN
4.0000 mg | Freq: Once | INTRAMUSCULAR | Status: AC
  Administered 2022-10-02: 4 mg via INTRAVENOUS
  Filled 2022-10-02: qty 2

## 2022-10-02 MED ORDER — OXYCODONE-ACETAMINOPHEN 5-325 MG PO TABS
1.0000 | ORAL_TABLET | Freq: Once | ORAL | Status: AC
  Administered 2022-10-02: 1 via ORAL
  Filled 2022-10-02: qty 1

## 2022-10-02 MED ORDER — OXYCODONE HCL 5 MG PO TABS
5.0000 mg | ORAL_TABLET | ORAL | Status: DC | PRN
  Administered 2022-10-02 – 2022-10-03 (×4): 5 mg via ORAL
  Filled 2022-10-02 (×4): qty 1

## 2022-10-02 MED ORDER — SODIUM CHLORIDE 0.9 % IV SOLN
25.0000 mg | Freq: Four times a day (QID) | INTRAVENOUS | Status: DC | PRN
  Filled 2022-10-02: qty 1

## 2022-10-02 MED ORDER — MORPHINE SULFATE (PF) 2 MG/ML IV SOLN
2.0000 mg | Freq: Once | INTRAVENOUS | Status: AC
  Administered 2022-10-02: 2 mg via INTRAVENOUS
  Filled 2022-10-02: qty 1

## 2022-10-02 MED ORDER — MORPHINE SULFATE (PF) 4 MG/ML IV SOLN
4.0000 mg | Freq: Once | INTRAVENOUS | Status: AC
  Administered 2022-10-02: 4 mg via INTRAVENOUS
  Filled 2022-10-02: qty 1

## 2022-10-02 MED ORDER — CYCLOBENZAPRINE HCL 5 MG PO TABS
10.0000 mg | ORAL_TABLET | Freq: Once | ORAL | Status: AC
  Administered 2022-10-02: 10 mg via ORAL
  Filled 2022-10-02: qty 2

## 2022-10-02 MED ORDER — FAMOTIDINE IN NACL 20-0.9 MG/50ML-% IV SOLN
20.0000 mg | Freq: Once | INTRAVENOUS | Status: AC
  Administered 2022-10-02: 20 mg via INTRAVENOUS
  Filled 2022-10-02: qty 50

## 2022-10-02 MED ORDER — PROMETHAZINE HCL 25 MG RE SUPP: 25.0000 mg | Freq: Four times a day (QID) | RECTAL | Status: DC | PRN

## 2022-10-02 MED ORDER — SODIUM CHLORIDE 0.9 % IV SOLN
1.0000 g | Freq: Once | INTRAVENOUS | Status: AC
  Administered 2022-10-02: 1 g via INTRAVENOUS
  Filled 2022-10-02: qty 10

## 2022-10-02 MED ORDER — SODIUM CHLORIDE 0.9 % IV BOLUS
1000.0000 mL | Freq: Once | INTRAVENOUS | Status: AC
  Administered 2022-10-02: 1000 mL via INTRAVENOUS

## 2022-10-02 MED ORDER — ACETAMINOPHEN 500 MG PO TABS: 1000.0000 mg | ORAL_TABLET | Freq: Once | ORAL | Status: DC

## 2022-10-02 MED ORDER — OXYCODONE HCL 5 MG PO TABS
5.0000 mg | ORAL_TABLET | Freq: Once | ORAL | Status: AC
  Administered 2022-10-02: 5 mg via ORAL
  Filled 2022-10-02: qty 1

## 2022-10-02 MED ORDER — ARIPIPRAZOLE 5 MG PO TABS
5.0000 mg | ORAL_TABLET | Freq: Every day | ORAL | Status: DC
  Administered 2022-10-02 – 2022-10-03 (×2): 5 mg via ORAL
  Filled 2022-10-02 (×2): qty 1

## 2022-10-02 MED ORDER — DOCUSATE SODIUM 100 MG PO CAPS
100.0000 mg | ORAL_CAPSULE | Freq: Every day | ORAL | Status: DC
  Administered 2022-10-02 – 2022-10-03 (×2): 100 mg via ORAL
  Filled 2022-10-02 (×2): qty 1

## 2022-10-02 MED ORDER — SODIUM CHLORIDE 0.9 % IV SOLN
12.5000 mg | Freq: Four times a day (QID) | INTRAVENOUS | Status: DC | PRN
  Administered 2022-10-02 – 2022-10-03 (×3): 12.5 mg via INTRAVENOUS
  Filled 2022-10-02: qty 0.5
  Filled 2022-10-02: qty 12.5
  Filled 2022-10-02 (×2): qty 0.5

## 2022-10-02 MED ORDER — PROMETHAZINE HCL 25 MG PO TABS: 12.5000 mg | ORAL_TABLET | Freq: Four times a day (QID) | ORAL | Status: DC | PRN

## 2022-10-02 MED ORDER — CALCIUM CARBONATE ANTACID 500 MG PO CHEW
2.0000 | CHEWABLE_TABLET | ORAL | Status: DC | PRN
  Administered 2022-10-02 – 2022-10-03 (×3): 400 mg via ORAL
  Filled 2022-10-02 (×3): qty 2

## 2022-10-02 NOTE — ED Notes (Signed)
Spoke with [facility], requested that fetal monitor be readjusted - done and HR noted to be 143. States they will speak with on call provider and call back with instructions whether fetal hear monitor can be discontinued

## 2022-10-02 NOTE — MAU Provider Note (Signed)
History     CSN: [id number]  Arrival date and time: 10/02/22 0503   Event Date/Time   First Provider Initiated Contact with Patient 10/02/22 1100      Chief Complaint  Patient presents with   Back Pain   Nausea   Emesis   Brooke Boatman V. , a  31 y.o. [id number] at [redacted]w[redacted]d presents to MAU via [facility] from [place] [facility]. Patient reports new onset back pain that started at 2 am this morning. Patient describes "Mid to lower back pain that is Constant." She states pain was 10/10 but is currently rating a 7/10. Patient states that she cannot describe the pain. Patient states that it hurts to take a deep breath. Denies SOB fever chills cold and flu like symptoms. N/v started with pain.  Denies headache, Blurred vision. Denies VB LOF And contractions. Endorses positive fetal movement .   Per Report form Dr. Rosalia Hammers throughout the duration of her ED visit she was given IV Morphine, Zofran,Tylenol, Oxycodone and a liter of fluid. Patient renal US normal .       OB History     Gravida  3   Para  2   Term  2   Preterm  0   AB  0   Living  2      SAB  0   IAB  0   Ectopic  0   Multiple  0   Live Births  2           Past Medical History:  Diagnosis Date   ADD (attention deficit disorder)    ADHD (attention deficit hyperactivity disorder)    Anxiety    sees Dr. Evelene Croon    Asthma    Depression    sees Dr. Evelene Croon    Hx of varicella    Migraines    sees Dr. Asa Lente   OCD (obsessive compulsive disorder)    Panic disorder    Vaginal Pap smear, abnormal     Past Surgical History:  Procedure Laterality Date   BLADDER SURGERY  2012   COLONOSCOPY      Family History  Problem Relation Age of Onset   Thyroid disease Mother    Diabetes Maternal Grandmother     Social History   Tobacco Use   Smoking status: Some Days    Packs/day: 0.25    Types: Cigarettes   Smokeless tobacco: Never   Tobacco comments:    or less  Vaping Use   Vaping Use: Never  used  Substance Use Topics   Alcohol use: Yes    Comment: glass of wine every other day   Drug use: No    Allergies:  Allergies  Allergen Reactions   Flagyl [Metronidazole] Diarrhea   Toradol [Ketorolac Tromethamine] Other (See Comments)    Reaction:  Tachycardia   Tramadol Other (See Comments)    Reaction:  Tachycardia    Medications Prior to Admission  Medication Sig Dispense Refill Last Dose   amphetamine-dextroamphetamine (ADDERALL) 20 MG tablet Take 20 mg by mouth 3 (three) times daily.   10/01/2022   ARIPiprazole (ABILIFY) 5 MG tablet Take 1 tablet (5 mg total) by mouth daily. 30 tablet 0 10/01/2022   omeprazole (PRILOSEC) 20 MG capsule Take 20 mg by mouth daily.   10/01/2022   FLUoxetine (PROZAC) 10 MG capsule Take 1 capsule (10 mg total) by mouth daily. 90 capsule 0 More than a month   methylphenidate (RITALIN) 10 MG tablet Take 1  tablet (10 mg total) by mouth daily. 30 tablet 0     Review of Systems  Constitutional:  Negative for chills, fatigue and fever.  Eyes:  Negative for pain and visual disturbance.  Respiratory:  Negative for apnea, shortness of breath and wheezing.   Cardiovascular:  Negative for chest pain and palpitations.  Gastrointestinal:  Positive for nausea and vomiting. Negative for abdominal pain, constipation and diarrhea.  Genitourinary:  Negative for difficulty urinating, dysuria, pelvic pain, vaginal bleeding, vaginal discharge and vaginal pain.  Musculoskeletal:  Positive for back pain.  Neurological:  Negative for seizures, weakness and headaches.  Psychiatric/Behavioral:  Negative for suicidal ideas.    Physical Exam   Blood pressure 132/67, pulse 78, temperature (!) 97.2 F (36.2 C), temperature source Axillary, resp. rate 17, height 5\' 8"  (1.727 m), weight 93 kg, SpO2 98 %, unknown if currently breastfeeding.  Physical Exam Vitals and nursing note reviewed.  Constitutional:      General: She is not in acute distress.    Appearance:  Normal appearance. She is ill-appearing. She is not diaphoretic.  HENT:     Head: Normocephalic.  Cardiovascular:     Rate and Rhythm: Normal rate and regular rhythm.     Heart sounds: Normal heart sounds.  Pulmonary:     Effort: Pulmonary effort is normal.  Abdominal:     Palpations: Abdomen is soft.  Musculoskeletal:     Cervical back: Normal range of motion.  Skin:    General: Skin is warm and dry.  Neurological:     Mental Status: She is alert and oriented to person, place, and time.     Comments: Patient A/O x4 just drowsy.   Psychiatric:        Mood and Affect: Mood normal.     MAU Course  Procedures Orders Placed This Encounter  Procedures   Urine Culture   Culture, OB Urine   Renal   US OB Limited   Urinalysis, Routine w reflex microscopic   CBC with Differential   Basic metabolic panel   Amylase   Lipase, blood   Urinalysis, Routine w reflex microscopic   Meds ordered this encounter  Medications   sodium chloride 0.9 % bolus 1,000 mL   DISCONTD: acetaminophen (TYLENOL) tablet 1,000 mg   oxyCODONE-acetaminophen (PERCOCET/ROXICET) 5-325 MG per tablet 1 tablet   cefTRIAXone (ROCEPHIN) 1 g in sodium chloride 0.9 % 100 mL IVPB    Order Specific Question:   Antibiotic Indication:    Answer:   UTI   morphine (PF) 2 MG/ML injection 2 mg   DISCONTD: promethazine (PHENERGAN) 25 mg in sodium chloride 0.9 % 50 mL IVPB   ondansetron (ZOFRAN) injection 4 mg   morphine (PF) 4 MG/ML injection 4 mg   morphine (PF) 2 MG/ML injection 2 mg   famotidine (PEPCID) IVPB 20 mg premix   ondansetron (ZOFRAN) injection 4 mg   cyclobenzaprine (FLEXERIL) tablet 10 mg   lactated ringers infusion    MDM - During Exam pain was drifting off to sleep.  - Pain improved from 8/10 down to 2/10 without MAU intervention.  - FHT Appropriate for gestational age.  - Patient was given IV Rocephin and Pain meds at outside Hospital.  - [place] reviewed by CNM in MAU, low suspicion for Kidney  stone. Urine red upon arrival to MAU. UA resent and Reflexed to culture.  -  Given WBC from other facility, suspicion for pyelonephritis.  - Called Dr. [place] reviewed patient presentation, lab  work, improving pain, nausea vomiting, and current clinical picture.  - Per MD. Admit to [facility] for overnight obs, IV antibiotics and fluids for Pyelo.   Assessment and Plan  - Report given to RN. Refer to MD for further instruction.    Claudette Head, MSN CNM  10/02/2022, 11:00 AM

## 2022-10-02 NOTE — Progress Notes (Signed)
Patient is reporting left flank pain. Left CVAT. Some nausea. Can drink some soda.  No fever or chills. BP 125/66 (BP Location: Right Arm)   Pulse 71   Temp 98.2 F (36.8 C) (Oral)   Resp 17   Ht 5\' 8"  (1.727 m)   Wt 93 kg   SpO2 99%   BMI 31.17 kg/m  Definite Left CVAT today  No uterine tenderness I believe she has pyelonephritis Urine culture pending Continue IV ancef Phenergan and oxycodone prn

## 2022-10-02 NOTE — ED Triage Notes (Signed)
[redacted] weeks pregnant with generalized back pain starting app 2 hours prior to arrival. Pregnant with 3rd baby.

## 2022-10-02 NOTE — ED Provider Notes (Signed)
  Physical Exam  BP (!) 112/96   Pulse 79   Temp 97.7 F (36.5 C) (Oral)   Resp 16   Ht 1.727 m (5\' 8" )   Wt 93 kg   SpO2 99%   BMI 31.17 kg/m   Physical Exam  Procedures  Procedures  ED Course / MDM   Clinical Course as of 10/02/22 0920  Tue Oct 02, 2022  0543 Spoke with Dr. Oct 04, 2022, on-call for [facility].  Agrees with tocometry.  No objective signs of labor.  Will give Tylenol and oxycodone for pain.  Check urine.  Monitor closely. [CH]  Elon Spanner No contractions noted on tocometry per Dr. [id number].  Patient with refractory pain.  Will obtain renal and OB ultrasound.  Will give a small dose of IV pain medication. [CH]  Elon Spanner Fetal heart rate 150s to 170s. [CH]  0749 Poke to Dr. [id number].  Would like to results of ultrasound.  However, likely will need transfer to MAU for admission for refractory pain and likely pyelonephritis. [CH]    Clinical Course User Index [CH] Horton, Vincente Poli, MD   Medical Decision Making Amount and/or Complexity of Data Reviewed Labs: ordered. Radiology: ordered.  Risk Prescription drug management.   31 yo g3p2 34.5 presents with back pain acute onset, non lateralized. Urine ? Uti 21-50 wbc Discussed with ob Dr. 38 Rocephin given Velvet Bathe pending Continues back pain And nausea Likely transfer to MAU Patient had felt better but feels like pain is coming back now in her left back. Will Discussed with Dr. [place] for direct admission Discussed with shape Vincente Poli on form [facility] and patient accepted     Dorathy Daft, MD 10/02/22 [phone]

## 2022-10-02 NOTE — ED Notes (Signed)
Contacted Virl Cagey, RN with rapid response.

## 2022-10-02 NOTE — ED Provider Notes (Signed)
[facility] EMERGENCY DEPT Provider Note   CSN: [id number] Arrival date & time: 10/02/22  0503     History  Chief Complaint  Patient presents with   Back Pain    Brooke Castaneda. is a 31 y.o. female.  HPI     This is a 31 year old G5, P2 female currently 34 weeks and 5 days pregnant with an estimated due date of January 25 who presents with back pain.  Patient reports acute onset of back pain this morning several hours prior to arrival.  She states it is across her upper back.  Pain does not radiate.  Denies chest pain or shortness of breath.  Reports uncomplicated pregnancy.  She has not noted loss of fluids.  She does not feel like she is contracting.  No recent urinary symptoms.  No history of kidney stones.  She reports good fetal movement.  She took tylenol with no relief.  OBGYN:  Morris  Home Medications Prior to Admission medications   Medication Sig Start Date End Date Taking? Authorizing Provider  ARIPiprazole (ABILIFY) 5 MG tablet Take 1 tablet (5 mg total) by mouth daily. 06/04/19   Arfeen, Phillips Grout, MD  FLUoxetine (PROZAC) 10 MG capsule Take 1 capsule (10 mg total) by mouth daily. 11/15/18   Arfeen, Phillips Grout, MD  methylphenidate (RITALIN) 10 MG tablet Take 1 tablet (10 mg total) by mouth daily. 12/30/18 12/30/19  Arfeen, Phillips Grout, MD      Allergies    Flagyl [metronidazole], Toradol [ketorolac tromethamine], and Tramadol    Review of Systems   Review of Systems  Constitutional:  Negative for fever.  Respiratory:  Negative for shortness of breath.   Cardiovascular:  Negative for chest pain.  Genitourinary:  Negative for dysuria, vaginal bleeding and vaginal discharge.  Musculoskeletal:  Positive for back pain.  All other systems reviewed and are negative.   Physical Exam Updated Vital Signs BP 129/68 (BP Location: Left Arm)   Pulse 65   Temp 97.7 F (36.5 C) (Oral)   Resp 20   Ht 1.727 m (5\' 8" )   Wt 93 kg   SpO2 99%   BMI 31.17 kg/m   Physical Exam Vitals and nursing note reviewed.  Constitutional:      Appearance: She is well-developed. She is obese.  HENT:     Head: Normocephalic and atraumatic.  Eyes:     Pupils: Pupils are equal, round, and reactive to light.  Cardiovascular:     Rate and Rhythm: Normal rate and regular rhythm.     Heart sounds: Normal heart sounds.  Pulmonary:     Effort: Pulmonary effort is normal. No respiratory distress.     Breath sounds: No wheezing.  Abdominal:     Palpations: Abdomen is soft.     Comments: Gravid above the umbilicus, no palpable contractions  Genitourinary:    Comments: Cervix closed and high, head not engaged, no bleeding, no discharge Musculoskeletal:     Cervical back: Neck supple.  Skin:    General: Skin is warm and dry.  Neurological:     Mental Status: She is alert and oriented to person, place, and time.  Psychiatric:        Mood and Affect: Mood normal.     ED Results / Procedures / Treatments   Labs (all labs ordered are listed, but only abnormal results are displayed) Labs Reviewed  URINALYSIS, ROUTINE W REFLEX MICROSCOPIC - Abnormal; Notable for the following components:  Result Value   APPearance HAZY (*)    Protein, ur 30 (*)    Leukocytes,Ua MODERATE (*)    Bacteria, UA FEW (*)    All other components within normal limits  CBC WITH DIFFERENTIAL/PLATELET - Abnormal; Notable for the following components:   WBC 14.9 (*)    Hemoglobin 11.4 (*)    HCT 34.1 (*)    Platelets 441 (*)    Neutro Abs 11.5 (*)    Abs Immature Granulocytes 0.10 (*)    All other components within normal limits  BASIC METABOLIC PANEL - Abnormal; Notable for the following components:   CO2 20 (*)    Glucose, Bld 121 (*)    All other components within normal limits  URINE CULTURE    EKG None  Radiology No results found.  Procedures Procedures    Medications Ordered in ED Medications  sodium chloride 0.9 % bolus 1,000 mL (0 mLs Intravenous Stopped  10/02/22 0710)  oxyCODONE-acetaminophen (PERCOCET/ROXICET) 5-325 MG per tablet 1 tablet (1 tablet Oral Given 10/02/22 0550)  cefTRIAXone (ROCEPHIN) 1 g in sodium chloride 0.9 % 100 mL IVPB (0 g Intravenous Stopped 10/02/22 0631)  morphine (PF) 2 MG/ML injection 2 mg (2 mg Intravenous Given 10/02/22 0624)  ondansetron (ZOFRAN) injection 4 mg (4 mg Intravenous Given 10/02/22 0729)  morphine (PF) 4 MG/ML injection 4 mg (4 mg Intravenous Given 10/02/22 0732)    ED Course/ Medical Decision Making/ A&P Clinical Course as of 10/02/22 0750  Tue Oct 02, 2022  0543 Spoke with Dr. Elon Spanner, on-call for [facility].  Agrees with tocometry.  No objective signs of labor.  Will give Tylenol and oxycodone for pain.  Check urine.  Monitor closely. [CH]  [id number] No contractions noted on tocometry per Dr. Elon Spanner.  Patient with refractory pain.  Will obtain renal and OB ultrasound.  Will give a small dose of IV pain medication. [CH]  2440 Fetal heart rate 150s to 170s. [CH]  0749 Poke to Dr. Vincente Poli.  Would like to results of ultrasound.  However, likely will need transfer to MAU for admission for refractory pain and likely pyelonephritis. [CH]    Clinical Course User Index [CH] Chelsie Burel, Mayer Masker, MD                           Medical Decision Making Amount and/or Complexity of Data Reviewed Labs: ordered. Radiology: ordered.  Risk Prescription drug management.   This patient presents to the ED for concern of back pain, this involves an extensive number of treatment options, and is a complaint that carries with it a high risk of complications and morbidity.  I considered the following differential and admission for this acute, potentially life threatening condition.  The differential diagnosis includes contractions, kidney stone, pyelonephritis, musculoskeletal etiology  MDM:    This is a 31 year old pregnant patient who presents with acute onset back pain.  She is uncomfortable appearing but nontoxic.   Vital signs are reassuring.  She does not appear to be contracting on tocometry and does not appear to be in active labor.  She had no loss of fluids.  Per [facility],, tree and baby looked good.  Fetal heart rate in 150s to 170s.  Urinalysis does appear somewhat dirty.  Urine culture was sent and patient was given a dose of Rocephin.  She had an ongoing pain refractory to oral pain medications.  She was subsequently given IV pain and nausea medication for  vomiting.  This could be pyelonephritis; however, kidney stones are also a consideration.  Will obtain a renal and an ultrasound.  (Labs, imaging, consults)  Labs: I Ordered, and personally interpreted labs.  The pertinent results include: CBC, BMP, urinalysis  Imaging Studies ordered: I ordered imaging studies including renal ultrasound, OB ultrasound I independently visualized and interpreted imaging. I agree with the radiologist interpretation  Additional history obtained from chart review.  External records from outside source obtained and reviewed including prior pregnancies  Cardiac Monitoring: The patient was maintained on a cardiac monitor.  I personally viewed and interpreted the cardiac monitored which showed an underlying rhythm of: Sinus rhythm  Reevaluation: After the interventions noted above, I reevaluated the patient and found that they have :stayed the same  Social Determinants of Health:  lives independently  Disposition: Likely admit  Co morbidities that complicate the patient evaluation  Past Medical History:  Diagnosis Date   ADD (attention deficit disorder)    ADHD (attention deficit hyperactivity disorder)    Anxiety    sees Dr. Evelene Croon    Asthma    Depression    sees Dr. Evelene Croon    Hx of varicella    Migraines    sees Dr. Asa Lente   OCD (obsessive compulsive disorder)    Panic disorder    Vaginal Pap smear, abnormal      Medicines Meds ordered this encounter  Medications   sodium chloride  0.9 % bolus 1,000 mL   DISCONTD: acetaminophen (TYLENOL) tablet 1,000 mg   oxyCODONE-acetaminophen (PERCOCET/ROXICET) 5-325 MG per tablet 1 tablet   cefTRIAXone (ROCEPHIN) 1 g in sodium chloride 0.9 % 100 mL IVPB    Order Specific Question:   Antibiotic Indication:    Answer:   UTI   morphine (PF) 2 MG/ML injection 2 mg   DISCONTD: promethazine (PHENERGAN) 25 mg in sodium chloride 0.9 % 50 mL IVPB   ondansetron (ZOFRAN) injection 4 mg   morphine (PF) 4 MG/ML injection 4 mg    I have reviewed the patients home medicines and have made adjustments as needed  Problem List / ED Course: Problem List Items Addressed This Visit   None Visit Diagnoses     Pyelonephritis    -  Primary   Relevant Medications   cefTRIAXone (ROCEPHIN) 1 g in sodium chloride 0.9 % 100 mL IVPB (Completed)                   Final Clinical Impression(s) / ED Diagnoses Final diagnoses:  Pyelonephritis    Rx / DC Orders ED Discharge Orders     None         Shon Baton, MD 10/02/22 [phone]

## 2022-10-02 NOTE — ED Notes (Signed)
Phenergan not available. Provider aware and switching order

## 2022-10-02 NOTE — ED Notes (Signed)
Awaiting pharmacy to verify medicine, patient updated.

## 2022-10-02 NOTE — H&P (Signed)
Brooke Dobbin V. is a 31 y.o. G 3 P 2002 at 22 w 5 days presents from [facility] ER . She presented with UTIs, back pain, nausea and vomiting. Urine suggestive of UTI. Possible pyelonephritis because of persistent symptoms. Admitted for observation/IV antibiotics OB History     Gravida  3   Para  2   Term  2   Preterm  0   AB  0   Living  2      SAB  0   IAB  0   Ectopic  0   Multiple  0   Live Births  2          Past Medical History:  Diagnosis Date   ADD (attention deficit disorder)    ADHD (attention deficit hyperactivity disorder)    Anxiety    sees Dr. Evelene Croon    Asthma    Depression    sees Dr. Evelene Croon    Hx of varicella    Migraines    sees Dr. Asa Lente   OCD (obsessive compulsive disorder)    Panic disorder    Vaginal Pap smear, abnormal    Past Surgical History:  Procedure Laterality Date   BLADDER SURGERY  2012   COLONOSCOPY     Family History: family history includes Diabetes in her maternal grandmother; Thyroid disease in her mother. Social History:  reports that she has been smoking. She has been smoking an average of .25 packs per day. She has never used smokeless tobacco. She reports current alcohol use. She reports that she does not use drugs.     Maternal Diabetes: No Genetic Screening: Normal Maternal Ultrasounds/Referrals: Normal Fetal Ultrasounds or other Referrals:  None Maternal Substance Abuse:  No Significant Maternal Medications:  None Significant Maternal Lab Results:  None Number of Prenatal Visits:greater than 3 verified prenatal visits Other Comments:  None  Review of Systems  All other systems reviewed and are negative.  History   Blood pressure (!) 111/53, pulse 73, temperature (!) 97.2 F (36.2 C), temperature source Axillary, resp. rate 17, height 5\' 8"  (1.727 m), weight 93 kg, SpO2 98 %, unknown if currently breastfeeding. Exam Physical Exam Vitals and nursing note reviewed. Exam conducted with a chaperone  present.  HENT:     Head: Normocephalic.  Cardiovascular:     Rate and Rhythm: Normal rate and regular rhythm.  Abdominal:     General: Abdomen is flat.     Palpations: Abdomen is soft.  Musculoskeletal:     Cervical back: Normal range of motion.  Neurological:     Mental Status: She is alert.     Prenatal labs: ABO, Rh:   Antibody:   Rubella:   RPR:    HBsAg:    HIV:    GBS:     Results for orders placed or performed during the hospital encounter of 10/02/22 (from the past 24 hour(s))  CBC with Differential     Status: Abnormal   Collection Time: 10/02/22  5:20 AM  Result Value Ref Range   WBC 14.9 (H) 4.0 - 10.5 K/uL   RBC 3.92 3.87 - 5.11 MIL/uL   Hemoglobin 11.4 (L) 12.0 - 15.0 g/dL   HCT 10/04/22 (L) 72.5 - 36.6 %   MCV 87.0 80.0 - 100.0 fL   MCH 29.1 26.0 - 34.0 pg   MCHC 33.4 30.0 - 36.0 g/dL   RDW 44.0 34.7 - 42.5 %   Platelets 441 (H) 150 - 400 K/uL   nRBC  0.0 0.0 - 0.2 %   Neutrophils Relative % 77 %   Neutro Abs 11.5 (H) 1.7 - 7.7 K/uL   Lymphocytes Relative 15 %   Lymphs Abs 2.3 0.7 - 4.0 K/uL   Monocytes Relative 6 %   Monocytes Absolute 0.9 0.1 - 1.0 K/uL   Eosinophils Relative 1 %   Eosinophils Absolute 0.1 0.0 - 0.5 K/uL   Basophils Relative 0 %   Basophils Absolute 0.0 0.0 - 0.1 K/uL   Immature Granulocytes 1 %   Abs Immature Granulocytes 0.10 (H) 0.00 - 0.07 K/uL  Basic metabolic panel     Status: Abnormal   Collection Time: 10/02/22  5:20 AM  Result Value Ref Range   Sodium 136 135 - 145 mmol/L   Potassium 3.6 3.5 - 5.1 mmol/L   Chloride 103 98 - 111 mmol/L   CO2 20 (L) 22 - 32 mmol/L   Glucose, Bld 121 (H) 70 - 99 mg/dL   BUN 8 6 - 20 mg/dL   Creatinine, Ser 5.63 0.44 - 1.00 mg/dL   Calcium 8.9 8.9 - 87.5 mg/dL   GFR, Estimated >64 >33 mL/min   Anion gap 13 5 - 15  Urinalysis, Routine w reflex microscopic Urine, Clean Catch     Status: Abnormal   Collection Time: 10/02/22  5:25 AM  Result Value Ref Range   Color, Urine YELLOW YELLOW    APPearance HAZY (A) CLEAR   Specific Gravity, Urine 1.027 1.005 - 1.030   pH 6.0 5.0 - 8.0   Glucose, UA NEGATIVE NEGATIVE mg/dL   Hgb urine dipstick NEGATIVE NEGATIVE   Bilirubin Urine NEGATIVE NEGATIVE   Ketones, ur NEGATIVE NEGATIVE mg/dL   Protein, ur 30 (A) NEGATIVE mg/dL   Nitrite NEGATIVE NEGATIVE   Leukocytes,Ua MODERATE (A) NEGATIVE   RBC / HPF 6-10 0 - 5 RBC/hpf   WBC, UA 21-50 0 - 5 WBC/hpf   Bacteria, UA FEW (A) NONE SEEN   Squamous Epithelial / LPF 6-10 0 - 5   Mucus PRESENT    Hyaline Casts, UA PRESENT   Urinalysis, Routine w reflex microscopic Urine, Clean Catch     Status: Abnormal   Collection Time: 10/02/22 11:23 AM  Result Value Ref Range   Color, Urine AMBER (A) YELLOW   APPearance HAZY (A) CLEAR   Specific Gravity, Urine 1.023 1.005 - 1.030   pH 6.0 5.0 - 8.0   Glucose, UA NEGATIVE NEGATIVE mg/dL   Hgb urine dipstick NEGATIVE NEGATIVE   Bilirubin Urine NEGATIVE NEGATIVE   Ketones, ur 80 (A) NEGATIVE mg/dL   Protein, ur 30 (A) NEGATIVE mg/dL   Nitrite NEGATIVE NEGATIVE   Leukocytes,Ua NEGATIVE NEGATIVE   RBC / HPF 11-20 0 - 5 RBC/hpf   WBC, UA 6-10 0 - 5 WBC/hpf   Bacteria, UA RARE (A) NONE SEEN   Squamous Epithelial / LPF 0-5 0 - 5   Mucus PRESENT    Non Squamous Epithelial 0-5 (A) NONE SEEN     Assessment/Plan: IUP at 34 w 5 days Possible Pyelonephritis  Send urine for culture  Admit overnight  IV fluids/IV ancef  Jeani Hawking 10/02/2022, 12:38 PM

## 2022-10-02 NOTE — ED Notes (Signed)
Reports some improvement in pain and nausea. Resting more comfortably in bed. Ginger ale provided

## 2022-10-02 NOTE — ED Notes (Signed)
Called [facility] and spoke to [place]; informed that this patient needed to be transported to MAU ASAP.

## 2022-10-02 NOTE — ED Notes (Signed)
Spoke with OB and OK to discontinue fetal monitor. Dr Rosalia Hammers notified. Patient tolerating ginger ale

## 2022-10-02 NOTE — ED Notes (Signed)
Per Virl Cagey, RN; continue monitoring at present.

## 2022-10-02 NOTE — Progress Notes (Signed)
RROB RN contacted regarding patient presenting with generalized back pain.  Patient G3P2, [redacted]w[redacted]d, denies cramping or contractions, denies LOF or vaginal bleeding, reports positive fetal movement.  Patient placed on EFM.  Attending physician notified.

## 2022-10-03 ENCOUNTER — Encounter (HOSPITAL_COMMUNITY): Payer: Self-pay | Admitting: General Practice

## 2022-10-03 ENCOUNTER — Inpatient Hospital Stay (EMERGENCY_DEPARTMENT_HOSPITAL)
Admission: AD | Admit: 2022-10-03 | Discharge: 2022-10-04 | Disposition: A | Discharge status: Home / Self Care | Payer: Medicare Other | Source: Home / Self Care | Attending: Obstetrics and Gynecology | Admitting: Obstetrics and Gynecology

## 2022-10-03 DIAGNOSIS — O99891 Other specified diseases and conditions complicating pregnancy: Secondary | ICD-10-CM | POA: Diagnosis not present

## 2022-10-03 DIAGNOSIS — N12 Tubulo-interstitial nephritis, not specified as acute or chronic: Principal | ICD-10-CM | POA: Diagnosis present

## 2022-10-03 DIAGNOSIS — O99513 Diseases of the respiratory system complicating pregnancy, third trimester: Secondary | ICD-10-CM | POA: Insufficient documentation

## 2022-10-03 DIAGNOSIS — O99343 Other mental disorders complicating pregnancy, third trimester: Secondary | ICD-10-CM | POA: Insufficient documentation

## 2022-10-03 DIAGNOSIS — O2303 Infections of kidney in pregnancy, third trimester: Secondary | ICD-10-CM | POA: Insufficient documentation

## 2022-10-03 DIAGNOSIS — Z3A35 35 weeks gestation of pregnancy: Secondary | ICD-10-CM | POA: Insufficient documentation

## 2022-10-03 DIAGNOSIS — Z3689 Encounter for other specified antenatal screening: Secondary | ICD-10-CM

## 2022-10-03 LAB — URINE CULTURE: Culture: NO GROWTH

## 2022-10-03 MED ORDER — CEPHALEXIN 500 MG PO CAPS
500.0000 mg | ORAL_CAPSULE | Freq: Once | ORAL | Status: AC
  Administered 2022-10-04: 500 mg via ORAL
  Filled 2022-10-03: qty 1

## 2022-10-03 MED ORDER — CEPHALEXIN 500 MG PO CAPS: 500.0000 mg | ORAL_CAPSULE | Freq: Four times a day (QID) | ORAL | 0 refills | Status: AC

## 2022-10-03 MED ORDER — ACETAMINOPHEN 325 MG PO TABS: 650.0000 mg | ORAL_TABLET | ORAL | 0 refills | Status: AC | PRN

## 2022-10-03 MED ORDER — OXYCODONE HCL 5 MG PO TABS: 5.0000 mg | ORAL_TABLET | ORAL | 0 refills | Status: DC | PRN

## 2022-10-03 MED ORDER — OXYCODONE HCL 5 MG PO TABS
5.0000 mg | ORAL_TABLET | Freq: Once | ORAL | Status: AC
  Administered 2022-10-03: 5 mg via ORAL
  Filled 2022-10-03: qty 1

## 2022-10-03 MED ORDER — NITROFURANTOIN MACROCRYSTAL 100 MG PO CAPS: 100.0000 mg | ORAL_CAPSULE | Freq: Every day | ORAL | 1 refills | Status: DC

## 2022-10-03 MED ORDER — ONDANSETRON HCL 4 MG/2ML IJ SOLN
4.0000 mg | Freq: Four times a day (QID) | INTRAMUSCULAR | Status: DC | PRN
  Administered 2022-10-03: 4 mg via INTRAVENOUS

## 2022-10-03 MED ORDER — ONDANSETRON HCL 4 MG/2ML IJ SOLN
4.0000 mg | Freq: Once | INTRAMUSCULAR | Status: DC
  Filled 2022-10-03: qty 2

## 2022-10-03 MED ORDER — ONDANSETRON HCL 4 MG PO TABS: 4.0000 mg | ORAL_TABLET | Freq: Three times a day (TID) | ORAL | 0 refills | Status: DC | PRN

## 2022-10-03 NOTE — Progress Notes (Signed)
Progress Note  She reports feeling much better. Was able to eat some biscuits and has been PO hydrating well. No further episodes of emesis since this morning. Lower back pain has improved.  Vitals:   10/03/22 0831 10/03/22 1216  BP: 121/71 113/66  Pulse: 77 81  Resp: 18 18  Temp: 98.3 F (36.8 C) 97.9 F (36.6 C)  SpO2: 100% 100%   No CVAT bilaterally Ucx no growth from [facility], Ucx from this admission still pending.  We discussed given high suspicion for pyelo, will plan to finish out 10 day antibiotic course and start daily macrodantin suppression after that. Warning signs/return precautions discussed with patient. She had an appt at the office today, will call to reschedule it for this week. Plan to discharge home with close office follow-up.  Jule Economy, MD

## 2022-10-03 NOTE — MAU Provider Note (Incomplete)
History     CSN: [id number]  Arrival date and time: 10/03/22 2155   Event Date/Time   First Provider Initiated Contact with Patient 10/03/22 2326      Chief Complaint  Patient presents with  . Back Pain   Brooke Castaneda is a 31 y.o. G3P2002 at [redacted]w[redacted]d who presents to MAU for back pain. Patient was discharged from the hospital earlier today with a diagnosis of pyelonephritis. Pt states her pain was well-controlled when she left the hospital this afternoon, but states that she was not able to get to the pharmacy before it closed to pick up her prescribed medication. As a result, she did not have her pain medication or antibiotics and her pain has returned. Pt states the pain is not worse or changed compared to when she was admitted, and believes she would have felt OK if she was able to pick up her medication and take them. Patient denies N/V/Fever. Patient confirms she does have a plan for someone to take her to a 24hr pharmacy tonight to transfer her prescription and pick them up when she leaves MAU, or to take her first thing in the morning when her pharmacy that has the prescriptions currently opens.  Pt was discharged home on Keflex 500mg  and  Oxycodone 5mg , will give these here at the hospital at this time.  Pt denies VB, LOF, ctx, decreased FM, vaginal discharge/odor/itching. Pt denies N/V, abdominal pain, constipation, diarrhea, or urinary problems. Pt denies fever, chills, fatigue, sweating or changes in appetite. Pt denies SOB or chest pain. Pt denies dizziness, HA, light-headedness, weakness.  Problems this pregnancy include: ***. Allergies? *** Current medications/supplements? *** Pregnant/postpartum/breastfeeding? *** Prenatal care provider? ***    OB History     Gravida  3   Para  2   Term  2   Preterm  0   AB  0   Living  2      SAB  0   IAB  0   Ectopic  0   Multiple  0   Live Births  2           Past Medical History:  Diagnosis  Date  . ADD (attention deficit disorder)   . ADHD (attention deficit hyperactivity disorder)   . Anxiety    sees Dr.   . Asthma   . Depression    sees Dr.   . Hx of varicella   . Migraines    sees Dr. Evelene Croon  . OCD (obsessive compulsive disorder)   . Panic disorder   . Vaginal Pap smear, abnormal     Past Surgical History:  Procedure Laterality Date  . BLADDER SURGERY  2012  . COLONOSCOPY      Family History  Problem Relation Age of Onset  . Thyroid disease Mother   . Diabetes Maternal Grandmother     Social History   Tobacco Use  . Smoking status: Some Days    Packs/day: 0.25    Types: Cigarettes  . Smokeless tobacco: Never  . Tobacco comments:    or less  Vaping Use  . Vaping Use: Never used  Substance Use Topics  . Alcohol use: Yes    Comment: glass of wine every other day  . Drug use: No    Allergies:  Allergies  Allergen Reactions  . Flagyl [Metronidazole] Diarrhea  . Toradol [Ketorolac Tromethamine] Other (See Comments)    Reaction:  Tachycardia  . Tramadol Other (See Comments)  Reaction:  Tachycardia    Medications Prior to Admission  Medication Sig Dispense Refill Last Dose  . ARIPiprazole (ABILIFY) 5 MG tablet Take 1 tablet (5 mg total) by mouth daily. 30 tablet 0 10/03/2022  . cephALEXin (KEFLEX) 500 MG capsule Take 1 capsule (500 mg total) by mouth 4 (four) times daily for 9 days. 36 capsule 0 10/03/2022 at 1600  . acetaminophen (TYLENOL) 325 MG tablet Take 2 tablets (650 mg total) by mouth every 4 (four) hours as needed (for pain scale < 4  OR  temperature  >/=  100.5 F). 30 tablet 0   . amphetamine-dextroamphetamine (ADDERALL) 20 MG tablet Take 20 mg by mouth 3 (three) times daily.     Marland Kitchen FLUoxetine (PROZAC) 10 MG capsule Take 1 capsule (10 mg total) by mouth daily. 90 capsule 0   . methylphenidate (RITALIN) 10 MG tablet Take 1 tablet (10 mg total) by mouth daily. 30 tablet 0   . nitrofurantoin (MACRODANTIN) 100 MG capsule  Take 1 capsule (100 mg total) by mouth daily. Start this medication after finishing out the cephalexin (Keflex) 60 capsule 1   . omeprazole (PRILOSEC) 20 MG capsule Take 20 mg by mouth daily.     . ondansetron (ZOFRAN) 4 MG tablet Take 1 tablet (4 mg total) by mouth every 8 (eight) hours as needed for nausea or vomiting. 24 tablet 0   . oxyCODONE (OXY IR/ROXICODONE) 5 MG immediate release tablet Take 1 tablet (5 mg total) by mouth every 4 (four) hours as needed for severe pain. 3 tablet 0     Review of Systems Physical Exam   Blood pressure 116/69, pulse 89, temperature 98.6 F (37 C), temperature source Oral, resp. rate 17, height 5\' 8"  (1.727 m), weight 95.9 kg, SpO2 99 %, unknown if currently breastfeeding.  Physical Exam  MAU Course  Procedures  MDM ***  Assessment and Plan  ***  Kealii Thueson 10/03/2022, 11:40 PM

## 2022-10-03 NOTE — MAU Note (Signed)
.  Brittnei Jagiello V. is a 31 y.o. at [redacted]w[redacted]d here in MAU reporting: she was just discharged at 1600 from [facility]. Was dx with kidney infection. RX for antibiotic and pain meds were called to pt's pharmacy but when her father in law went to get it his id was expired and then the pharmacy closed before they could get it transferred. Reports fetal movement. Complains of mid to lower back pain.   Onset of complaint: today Pain score: 8/10 Vitals:   10/03/22 2207  BP: 116/69  Pulse: 89  Resp: 17  Temp: 98.6 F (37 C)  SpO2: 99%

## 2022-10-03 NOTE — Progress Notes (Signed)
Antepartum Progress Note  S: Patient continuing to experience nausea, intermittent episodes of emesis. Reports she still feels "bad", but her lower back pain is improved as compared with yesterday. Has not tolerated solid food without emesis since admission. Asking for her daily adderall and PPI.  O: Vitals:   10/03/22 0430 10/03/22 0831  BP: (!) 122/47 121/71  Pulse: 66 77  Resp: 18 18  Temp: 98.3 F (36.8 C) 98.3 F (36.8 C)  SpO2: 96% 100%   Gen: Sitting in bed with emesis bag Resp: No increased WOB CV: regular heart rate, normal peripheral perfusion Abd: Nontender, gravid GU: Mild L and R CVAT Ext: No swelling, nontender  NST yesterday reactive   A/P: 31yo [id number] @ [redacted]w[redacted]d admitted with pyelonephritis  Pyelo: UA concerning for UTI, Ucx still pending. Pulse ox has been wnl. Continue IV antibiotics for now, continues to have mild L and R CVAT. Improved per patient but still experiencing pain. Prn pain meds ordered. Will reassess this afternoon. Discussed if consistently able to tolerate PO intake, will plan to transition to oral antibiotics and will need to be on suppression therapy for the rest of pregnancy.   Nausea/Vomiting: prn zofran and phenergan ordered. Will continue PO trial. Continue IVF.  Mixed OCD/ADHD/Panic disorder: continue abilify and adderall  Continue daily NSTs.  Dispo: continue admission until able to tolerate PO intake and symptoms improve  Jule Economy, MD

## 2022-10-03 NOTE — Discharge Summary (Addendum)
Physician Discharge Summary  Patient ID: Brooke Castaneda MRN: [id number] DOB/AGE: March 24, 1991 31 y.o.  Admit date: 10/02/2022 Discharge date: 10/03/2022  Admission Diagnoses:  Discharge Diagnoses:  Principal Problem:   Pyelonephritis affecting pregnancy Active Problems:   Pyelonephritis   Discharged Condition: good  Hospital Course:  Patient was admitted with back pain, nausea with concern for pyelonephritis. She was started on IV antibiotics and remained afebrile during her hospitalization. On HD2, her symptoms improved and she was able to tolerate good PO intake. She was discharged home with a total 10 day course of PO antibiotics and macrodantin suppression.  Consults: None  Significant Diagnostic Studies:  Recent Results (from the past 2160 hour(s))  CBC with Differential     Status: Abnormal   Collection Time: 10/02/22  5:20 AM  Result Value Ref Range   WBC 14.9 (H) 4.0 - 10.5 K/uL   RBC 3.92 3.87 - 5.11 MIL/uL   Hemoglobin 11.4 (L) 12.0 - 15.0 g/dL   HCT 78.5 (L) 88.5 - 02.7 %   MCV 87.0 80.0 - 100.0 fL   MCH 29.1 26.0 - 34.0 pg   MCHC 33.4 30.0 - 36.0 g/dL   RDW 74.1 28.7 - 86.7 %   Platelets 441 (H) 150 - 400 K/uL   nRBC 0.0 0.0 - 0.2 %   Neutrophils Relative % 77 %   Neutro Abs 11.5 (H) 1.7 - 7.7 K/uL   Lymphocytes Relative 15 %   Lymphs Abs 2.3 0.7 - 4.0 K/uL   Monocytes Relative 6 %   Monocytes Absolute 0.9 0.1 - 1.0 K/uL   Eosinophils Relative 1 %   Eosinophils Absolute 0.1 0.0 - 0.5 K/uL   Basophils Relative 0 %   Basophils Absolute 0.0 0.0 - 0.1 K/uL   Immature Granulocytes 1 %   Abs Immature Granulocytes 0.10 (H) 0.00 - 0.07 K/uL    Comment: Performed at [facility], [address] [place], [place] [address]  Basic metabolic panel     Status: Abnormal   Collection Time: 10/02/22  5:20 AM  Result Value Ref Range   Sodium 136 135 - 145 mmol/L   Potassium 3.6 3.5 - 5.1 mmol/L   Chloride 103 98 - 111 mmol/L   CO2 20 (L) 22 -  32 mmol/L   Glucose, Bld 121 (H) 70 - 99 mg/dL    Comment: Glucose reference range applies only to samples taken after fasting for at least 8 hours.   BUN 8 6 - 20 mg/dL   Creatinine, Ser 4.70 0.44 - 1.00 mg/dL   Calcium 8.9 8.9 - 96.2 mg/dL   GFR, Estimated >83 >66 mL/min    Comment: (NOTE) Calculated using the CKD-EPI Creatinine Equation (2021)    Anion gap 13 5 - 15    Comment: Performed at [facility], [address], [place], [place] [address]  Urinalysis, Routine w reflex microscopic Urine, Clean Catch     Status: Abnormal   Collection Time: 10/02/22  5:25 AM  Result Value Ref Range   Color, Urine YELLOW YELLOW   APPearance HAZY (A) CLEAR   Specific Gravity, Urine 1.027 1.005 - 1.030   pH 6.0 5.0 - 8.0   Glucose, UA NEGATIVE NEGATIVE mg/dL   Hgb urine dipstick NEGATIVE NEGATIVE   Bilirubin Urine NEGATIVE NEGATIVE   Ketones, ur NEGATIVE NEGATIVE mg/dL   Protein, ur 30 (A) NEGATIVE mg/dL   Nitrite NEGATIVE NEGATIVE   Leukocytes,Ua MODERATE (A) NEGATIVE   RBC / HPF 6-10 0 -  5 RBC/hpf   WBC, UA 21-50 0 - 5 WBC/hpf   Bacteria, UA FEW (A) NONE SEEN   Squamous Epithelial / LPF 6-10 0 - 5   Mucus PRESENT    Hyaline Casts, UA PRESENT     Comment: Performed at [facility], [address], [place], [place] [address]  Urine Culture     Status: None   Collection Time: 10/02/22  5:25 AM   Specimen: Urine, Clean Catch  Result Value Ref Range   Specimen Description      URINE, CLEAN CATCH Performed at [facility], [address], [place], [place] [address]    Special Requests      NONE Performed at [facility], [address] [place], [place] [address]    Culture      NO GROWTH Performed at [facility], [address], [place], [place] [address]    Report Status 10/03/2022 FINAL   Urinalysis, Routine w reflex microscopic Urine, Clean Catch     Status: Abnormal   Collection Time:  10/02/22 11:23 AM  Result Value Ref Range   Color, Urine AMBER (A) YELLOW    Comment: BIOCHEMICALS MAY BE AFFECTED BY COLOR   APPearance HAZY (A) CLEAR   Specific Gravity, Urine 1.023 1.005 - 1.030   pH 6.0 5.0 - 8.0   Glucose, UA NEGATIVE NEGATIVE mg/dL   Hgb urine dipstick NEGATIVE NEGATIVE   Bilirubin Urine NEGATIVE NEGATIVE   Ketones, ur 80 (A) NEGATIVE mg/dL   Protein, ur 30 (A) NEGATIVE mg/dL   Nitrite NEGATIVE NEGATIVE   Leukocytes,Ua NEGATIVE NEGATIVE   RBC / HPF 11-20 0 - 5 RBC/hpf   WBC, UA 6-10 0 - 5 WBC/hpf   Bacteria, UA RARE (A) NONE SEEN   Squamous Epithelial / LPF 0-5 0 - 5   Mucus PRESENT    Non Squamous Epithelial 0-5 (A) NONE SEEN    Comment: Performed at [facility], [address], [place], [place] [address]  Amylase     Status: None   Collection Time: 10/02/22 11:35 AM  Result Value Ref Range   Amylase 53 28 - 100 U/L    Comment: Performed at [facility], [address], [place], [place] [address]  Lipase, blood     Status: None   Collection Time: 10/02/22 11:35 AM  Result Value Ref Range   Lipase 27 11 - 51 U/L    Comment: Performed at [facility], [address], [place], [place] [address]  Type and screen [facility]     Status: None   Collection Time: 10/02/22  1:02 PM  Result Value Ref Range   ABO/RH(D) O POS    Antibody Screen NEG    Sample Expiration      10/05/2022,2359 Performed at [facility], [address], [place], [place] [address]      Treatments: IV hydration, antibiotics: Ancef, and analgesia: acetaminophen and oxycodone  Disposition: Discharge disposition: 01-Home or Self Care       Discharge Instructions     Discharge activity:  No Restrictions   Complete by: As directed    Discharge diet:  No restrictions   Complete by: As directed    Discharge instructions   Complete by: As directed    Call back with fever, chills, worsening lower back pain.   No sexual activity  restrictions   Complete by: As directed    Notify physician for  a general feeling that "something is not right"   Complete by: As directed    Notify physician for increase or change in vaginal discharge   Complete by: As directed    Notify physician for intestinal cramps, with or without diarrhea, sometimes described as "gas pain"   Complete by: As directed    Notify physician for leaking of fluid   Complete by: As directed    Notify physician for low, dull backache, unrelieved by heat or Tylenol   Complete by: As directed    Notify physician for menstrual like cramps   Complete by: As directed    Notify physician for pelvic pressure   Complete by: As directed    Notify physician for uterine contractions.  These may be painless and feel like the uterus is tightening or the baby is  "balling up"   Complete by: As directed    Notify physician for vaginal bleeding   Complete by: As directed    PRETERM LABOR:  Includes any of the follwing symptoms that occur between 20 - [redacted] weeks gestation.  If these symptoms are not stopped, preterm labor can result in preterm delivery, placing your baby at risk   Complete by: As directed       Allergies as of 10/03/2022       Reactions   Flagyl [metronidazole] Diarrhea   Toradol [ketorolac Tromethamine] Other (See Comments)   Reaction:  Tachycardia   Tramadol Other (See Comments)   Reaction:  Tachycardia        Medication List     TAKE these medications    acetaminophen 325 MG tablet Commonly known as: TYLENOL Take 2 tablets (650 mg total) by mouth every 4 (four) hours as needed (for pain scale < 4  OR  temperature  >/=  100.5 F).   amphetamine-dextroamphetamine 20 MG tablet Commonly known as: ADDERALL Take 20 mg by mouth 3 (three) times daily.   ARIPiprazole 5 MG tablet Commonly known as: ABILIFY Take 1 tablet (5 mg total) by mouth daily.   cephALEXin 500 MG capsule Commonly known as: Keflex Take 1 capsule (500 mg total) by  mouth 4 (four) times daily for 9 days.   FLUoxetine 10 MG capsule Commonly known as: PROZAC Take 1 capsule (10 mg total) by mouth daily.   methylphenidate 10 MG tablet Commonly known as: Ritalin Take 1 tablet (10 mg total) by mouth daily.   nitrofurantoin 100 MG capsule Commonly known as: Macrodantin Take 1 capsule (100 mg total) by mouth daily. Start this medication after finishing out the cephalexin (Keflex)   omeprazole 20 MG capsule Commonly known as: PRILOSEC Take 20 mg by mouth daily.   ondansetron 4 MG tablet Commonly known as: Zofran Take 1 tablet (4 mg total) by mouth every 8 (eight) hours as needed for nausea or vomiting.   oxyCODONE 5 MG immediate release tablet Commonly known as: Oxy IR/ROXICODONE Take 1 tablet (5 mg total) by mouth every 4 (four) hours as needed for severe pain.         Signed: Tawni Levy 10/03/2022, 3:52 PM

## 2022-10-03 NOTE — MAU Provider Note (Signed)
History     CSN: [id number]  Arrival date and time: 10/03/22 2155   Event Date/Time   First Provider Initiated Contact with Patient 10/03/22 2326      Chief Complaint  Patient presents with   Back Pain   Brooke Castaneda is a 31 y.o. G3P2002 at [redacted]w[redacted]d who presents to MAU for back pain. Patient was discharged from the hospital earlier today with a diagnosis of pyelonephritis. Pt states her pain was well-controlled when she left the hospital this afternoon, but states that she was not able to get to the pharmacy before it closed to pick up her prescribed medication. As a result, she did not have her pain medication or antibiotics and her pain has returned. Pt states the pain is not worse or changed compared to when she was admitted, and believes she would have felt OK if she was able to pick up her medication and take them. Patient denies N/V/Fever. Patient confirms she does have a plan for someone to take her to a 24hr pharmacy tonight to transfer her prescription and pick them up when she leaves MAU, or to take her first thing in the morning when her pharmacy that has the prescriptions currently opens.  Pt was discharged home on Keflex 500mg  and  Oxycodone 5mg , will give these here at the hospital at this time.  Pt denies VB, LOF, ctx, decreased FM, vaginal discharge/odor/itching. Pt denies N/V, abdominal pain, constipation, diarrhea, or urinary problems. Pt denies fever, chills, fatigue, sweating or changes in appetite. Pt denies SOB or chest pain. Pt denies dizziness, HA, light-headedness, weakness.    OB History     Gravida  3   Para  2   Term  2   Preterm  0   AB  0   Living  2      SAB  0   IAB  0   Ectopic  0   Multiple  0   Live Births  2           Past Medical History:  Diagnosis Date   ADD (attention deficit disorder)    ADHD (attention deficit hyperactivity disorder)    Anxiety    sees Dr.    Asthma    Depression    sees Dr.     Hx of varicella    Migraines    sees Dr. Evelene Croon   OCD (obsessive compulsive disorder)    Panic disorder    Vaginal Pap smear, abnormal     Past Surgical History:  Procedure Laterality Date   BLADDER SURGERY  2012   COLONOSCOPY      Family History  Problem Relation Age of Onset   Thyroid disease Mother    Diabetes Maternal Grandmother     Social History   Tobacco Use   Smoking status: Some Days    Packs/day: 0.25    Types: Cigarettes   Smokeless tobacco: Never   Tobacco comments:    or less  Vaping Use   Vaping Use: Never used  Substance Use Topics   Alcohol use: Yes    Comment: glass of wine every other day   Drug use: No    Allergies:  Allergies  Allergen Reactions   Flagyl [Metronidazole] Diarrhea   Toradol [Ketorolac Tromethamine] Other (See Comments)    Reaction:  Tachycardia   Tramadol Other (See Comments)    Reaction:  Tachycardia    Medications Prior to Admission  Medication Sig Dispense Refill  Last Dose   ARIPiprazole (ABILIFY) 5 MG tablet Take 1 tablet (5 mg total) by mouth daily. 30 tablet 0 10/03/2022   cephALEXin (KEFLEX) 500 MG capsule Take 1 capsule (500 mg total) by mouth 4 (four) times daily for 9 days. 36 capsule 0 10/03/2022 at 1600   acetaminophen (TYLENOL) 325 MG tablet Take 2 tablets (650 mg total) by mouth every 4 (four) hours as needed (for pain scale < 4  OR  temperature  >/=  100.5 F). 30 tablet 0    amphetamine-dextroamphetamine (ADDERALL) 20 MG tablet Take 20 mg by mouth 3 (three) times daily.      FLUoxetine (PROZAC) 10 MG capsule Take 1 capsule (10 mg total) by mouth daily. 90 capsule 0    methylphenidate (RITALIN) 10 MG tablet Take 1 tablet (10 mg total) by mouth daily. 30 tablet 0    nitrofurantoin (MACRODANTIN) 100 MG capsule Take 1 capsule (100 mg total) by mouth daily. Start this medication after finishing out the cephalexin (Keflex) 60 capsule 1    omeprazole (PRILOSEC) 20 MG capsule Take 20 mg by mouth daily.       ondansetron (ZOFRAN) 4 MG tablet Take 1 tablet (4 mg total) by mouth every 8 (eight) hours as needed for nausea or vomiting. 24 tablet 0    oxyCODONE (OXY IR/ROXICODONE) 5 MG immediate release tablet Take 1 tablet (5 mg total) by mouth every 4 (four) hours as needed for severe pain. 3 tablet 0     Review of Systems  Constitutional:  Negative for chills, diaphoresis, fatigue and fever.  Eyes:  Negative for visual disturbance.  Respiratory:  Negative for shortness of breath.   Cardiovascular:  Negative for chest pain.  Gastrointestinal:  Negative for abdominal pain, constipation, diarrhea, nausea and vomiting.  Genitourinary:  Negative for dysuria, flank pain, frequency, pelvic pain, urgency, vaginal bleeding and vaginal discharge.  Musculoskeletal:  Positive for back pain.  Neurological:  Negative for dizziness, weakness, light-headedness and headaches.   Physical Exam   Blood pressure 116/69, pulse 89, temperature 98.6 F (37 C), temperature source Oral, resp. rate 17, height 5\' 8"  (1.727 m), weight 95.9 kg, SpO2 99 %, unknown if currently breastfeeding.  Patient Vitals for the past 24 hrs:  BP Temp Temp src Pulse Resp SpO2 Height Weight  10/03/22 2207 116/69 98.6 F (37 C) Oral 89 17 99 % 5\' 8"  (1.727 m) 95.9 kg   Physical Exam Vitals and nursing note reviewed.  Constitutional:      General: She is in acute distress.     Appearance: Normal appearance. She is not ill-appearing, toxic-appearing or diaphoretic.  HENT:     Head: Normocephalic and atraumatic.  Pulmonary:     Effort: Pulmonary effort is normal.  Neurological:     Mental Status: She is alert and oriented to person, place, and time.  Psychiatric:        Mood and Affect: Mood normal.        Behavior: Behavior normal.        Thought Content: Thought content normal.        Judgment: Judgment normal.    MAU Course  Procedures  MDM -pt with returning, but not worsening pain after hospital discharge this  afternoon -pain initially 10/10 -oxycodone 5mg  and Keflex 500mg  given in MAU as patient was not able to pick up medications from pharmacy -after administration, pt reports pain now 2/10 and resting comfortably in room -plan with patient to either go to 24hr pharmacy after  discharge from MAU and transfer prescription, or go to pharmacy when it opens in the morning -EFM: reactive       -baseline: 140/150       -variability: moderate       -accels: present, 15x15       -decels: absent       -TOCO: quiet -pt discharged to home in stable condition  Assessment and Plan   1. Pyelonephritis affecting pregnancy in third trimester   2. [redacted] weeks gestation of pregnancy   3. NST (non-stress test) reactive    Allergies as of 10/04/2022       Reactions   Flagyl [metronidazole] Diarrhea   Toradol [ketorolac Tromethamine] Other (See Comments)   Reaction:  Tachycardia   Tramadol Other (See Comments)   Reaction:  Tachycardia        Medication List     TAKE these medications    acetaminophen 325 MG tablet Commonly known as: TYLENOL Take 2 tablets (650 mg total) by mouth every 4 (four) hours as needed (for pain scale < 4  OR  temperature  >/=  100.5 F).   amphetamine-dextroamphetamine 20 MG tablet Commonly known as: ADDERALL Take 20 mg by mouth 3 (three) times daily.   ARIPiprazole 5 MG tablet Commonly known as: ABILIFY Take 1 tablet (5 mg total) by mouth daily.   cephALEXin 500 MG capsule Commonly known as: Keflex Take 1 capsule (500 mg total) by mouth 4 (four) times daily for 9 days.   FLUoxetine 10 MG capsule Commonly known as: PROZAC Take 1 capsule (10 mg total) by mouth daily.   methylphenidate 10 MG tablet Commonly known as: Ritalin Take 1 tablet (10 mg total) by mouth daily.   nitrofurantoin 100 MG capsule Commonly known as: Macrodantin Take 1 capsule (100 mg total) by mouth daily. Start this medication after finishing out the cephalexin (Keflex)   omeprazole 20 MG  capsule Commonly known as: PRILOSEC Take 20 mg by mouth daily.   ondansetron 4 MG tablet Commonly known as: Zofran Take 1 tablet (4 mg total) by mouth every 8 (eight) hours as needed for nausea or vomiting.   oxyCODONE 5 MG immediate release tablet Commonly known as: Oxy IR/ROXICODONE Take 1 tablet (5 mg total) by mouth every 4 (four) hours as needed for severe pain.       -return MAU precautions given -pt discharged to home in stable condition  Joni Reining E Yesenia Locurto 10/04/2022, 1:11 AM

## 2022-10-04 DIAGNOSIS — O2303 Infections of kidney in pregnancy, third trimester: Secondary | ICD-10-CM

## 2022-10-04 DIAGNOSIS — Z3A35 35 weeks gestation of pregnancy: Secondary | ICD-10-CM

## 2022-10-04 DIAGNOSIS — O99891 Other specified diseases and conditions complicating pregnancy: Secondary | ICD-10-CM | POA: Diagnosis not present

## 2022-10-04 LAB — CULTURE, OB URINE: Culture: NO GROWTH

## 2022-10-15 NOTE — L&D Delivery Note (Signed)
Delivery Note  SVD viable female Apgars 9,9 over intact perineum.  Placenta delivered spontaneously intact with 3VC.  Good support and hemostasis noted.  R/V exam confirms.  PH art was not done.   Mother and baby to couplet care and are doing well.  EBL 112cc  Louretta Shorten, MD

## 2022-10-20 ENCOUNTER — Inpatient Hospital Stay (EMERGENCY_DEPARTMENT_HOSPITAL)
Admission: AD | Admit: 2022-10-20 | Discharge: 2022-10-20 | Disposition: A | Discharge status: Home / Self Care | Payer: 59 | Source: Home / Self Care | Attending: Obstetrics and Gynecology | Admitting: Obstetrics and Gynecology

## 2022-10-20 ENCOUNTER — Encounter (HOSPITAL_COMMUNITY): Payer: Self-pay | Admitting: Obstetrics and Gynecology

## 2022-10-20 DIAGNOSIS — O471 False labor at or after 37 completed weeks of gestation: Secondary | ICD-10-CM | POA: Insufficient documentation

## 2022-10-20 DIAGNOSIS — Z3A37 37 weeks gestation of pregnancy: Secondary | ICD-10-CM | POA: Diagnosis not present

## 2022-10-20 NOTE — MAU Note (Signed)
I have communicated with Sharolyn Douglas, CNM and reviewed vital signs:  Vitals:   10/20/22 0340 10/20/22 0448  BP: 117/73 119/68  Pulse: (!) 102 82  Resp:    Temp:      Vaginal exam:  Dilation: 1 Effacement (%): 50 Cervical Position: Posterior Station: -3 Presentation: Vertex Exam by:: Youlanda Roys, RN,   Also reviewed contraction pattern and that non-stress test is reactive.  It has been documented that patient is contracting every 2-5 minutes with no cervical change over 1 hour not indicating active labor.  Patient denies any other complaints.  Based on this report provider has given order for discharge.  A discharge order and diagnosis entered by a provider.   Labor discharge instructions reviewed with patient.

## 2022-10-20 NOTE — MAU Note (Signed)
Pt says UC strong since 0200. VE- yesterday -  1 cm [facility]- Dr Mardelle Matte Denies HSV GBS- collected yesterday

## 2022-10-20 NOTE — Discharge Instructions (Signed)

## 2022-10-20 NOTE — MAU Provider Note (Signed)
Event Date/Time   First Provider Initiated Contact with Patient 10/20/22 0448       S: Ms. Brooke Castaneda. is a 32 y.o. G3P2002 at [redacted]w[redacted]d  who presents to MAU today complaining contractions q 4 minutes since 0200. She denies vaginal bleeding. She denies LOF. She reports normal fetal movement.    O: BP 117/73   Pulse (!) 102   Temp 97.9 F (36.6 C) (Oral)   Resp 16   Ht 5\' 8"  (1.727 m)   Wt 97.4 kg   BMI 32.65 kg/m  GENERAL: Well-developed, well-nourished female in no acute distress.  HEAD: Normocephalic, atraumatic.  CHEST: Normal effort of breathing, regular heart rate ABDOMEN: Soft, nontender, gravid  Cervical exam:  Dilation: 1 Effacement (%): 50 Cervical Position: Posterior Station: -3 Presentation: Vertex Exam by:: Youlanda Roys, RN   Fetal Monitoring: Baseline: 120 Variability: moderate Accelerations: 15x15 Decelerations: none Contractions: 4-10  Patient watched and rechecked after 1 hour with no cervical change.   A: SIUP at [redacted]w[redacted]d  False labor  P: -Discharge home in stable condition -Labor precautions discussed -Patient advised to follow-up with OB as scheduled for prenatal care -Patient may return to MAU as needed or if her condition were to change or worsen   Wende Mott, [place] 10/20/2022 4:48 AM

## 2022-10-22 ENCOUNTER — Inpatient Hospital Stay (HOSPITAL_COMMUNITY)
Admission: AD | Admit: 2022-10-22 | Discharge: 2022-10-23 | DRG: 807 | Disposition: A | Discharge status: Home / Self Care | Payer: 59 | Attending: Obstetrics and Gynecology | Admitting: Obstetrics and Gynecology

## 2022-10-22 ENCOUNTER — Other Ambulatory Visit: Payer: Self-pay

## 2022-10-22 ENCOUNTER — Encounter (HOSPITAL_COMMUNITY): Payer: Self-pay | Admitting: Obstetrics and Gynecology

## 2022-10-22 DIAGNOSIS — O99334 Smoking (tobacco) complicating childbirth: Principal | ICD-10-CM | POA: Diagnosis present

## 2022-10-22 DIAGNOSIS — Z3A37 37 weeks gestation of pregnancy: Secondary | ICD-10-CM

## 2022-10-22 DIAGNOSIS — O99344 Other mental disorders complicating childbirth: Secondary | ICD-10-CM | POA: Diagnosis present

## 2022-10-22 DIAGNOSIS — F429 Obsessive-compulsive disorder, unspecified: Secondary | ICD-10-CM | POA: Diagnosis present

## 2022-10-22 DIAGNOSIS — O26893 Other specified pregnancy related conditions, third trimester: Secondary | ICD-10-CM | POA: Diagnosis present

## 2022-10-22 DIAGNOSIS — F17210 Nicotine dependence, cigarettes, uncomplicated: Secondary | ICD-10-CM | POA: Diagnosis present

## 2022-10-22 DIAGNOSIS — F419 Anxiety disorder, unspecified: Secondary | ICD-10-CM | POA: Diagnosis present

## 2022-10-22 LAB — CBC
HCT: 33.7 % — ABNORMAL LOW (ref 36.0–46.0)
Hemoglobin: 11.1 g/dL — ABNORMAL LOW (ref 12.0–15.0)
MCH: 28.7 pg (ref 26.0–34.0)
MCHC: 32.9 g/dL (ref 30.0–36.0)
MCV: 87.1 fL (ref 80.0–100.0)
Platelets: 419 10*3/uL — ABNORMAL HIGH (ref 150–400)
RBC: 3.87 MIL/uL (ref 3.87–5.11)
RDW: 13.6 % (ref 11.5–15.5)
WBC: 12.6 10*3/uL — ABNORMAL HIGH (ref 4.0–10.5)
nRBC: 0 % (ref 0.0–0.2)

## 2022-10-22 LAB — TYPE AND SCREEN
ABO/RH(D): O POS
Antibody Screen: NEGATIVE

## 2022-10-22 LAB — RPR: RPR Ser Ql: NONREACTIVE

## 2022-10-22 LAB — OB RESULTS CONSOLE GBS: GBS: NEGATIVE

## 2022-10-22 LAB — POCT FERN TEST: POCT Fern Test: NEGATIVE

## 2022-10-22 MED ORDER — OXYCODONE-ACETAMINOPHEN 5-325 MG PO TABS: 2.0000 | ORAL_TABLET | ORAL | Status: DC | PRN

## 2022-10-22 MED ORDER — LACTATED RINGERS IV SOLN: 500.0000 mL | INTRAVENOUS | Status: DC | PRN

## 2022-10-22 MED ORDER — PANTOPRAZOLE SODIUM 40 MG PO TBEC
40.0000 mg | DELAYED_RELEASE_TABLET | Freq: Every day | ORAL | Status: DC
  Filled 2022-10-22: qty 1

## 2022-10-22 MED ORDER — AMPHETAMINE-DEXTROAMPHETAMINE 5 MG PO TABS
20.0000 mg | ORAL_TABLET | Freq: Three times a day (TID) | ORAL | Status: DC
  Administered 2022-10-22 – 2022-10-23 (×2): 20 mg via ORAL
  Filled 2022-10-22 (×2): qty 4

## 2022-10-22 MED ORDER — ONDANSETRON HCL 4 MG/2ML IJ SOLN: 4.0000 mg | INTRAMUSCULAR | Status: DC | PRN

## 2022-10-22 MED ORDER — OXYCODONE-ACETAMINOPHEN 5-325 MG PO TABS: 1.0000 | ORAL_TABLET | ORAL | Status: DC | PRN

## 2022-10-22 MED ORDER — ZOLPIDEM TARTRATE 5 MG PO TABS: 5.0000 mg | ORAL_TABLET | Freq: Every evening | ORAL | Status: DC | PRN

## 2022-10-22 MED ORDER — SENNOSIDES-DOCUSATE SODIUM 8.6-50 MG PO TABS
2.0000 | ORAL_TABLET | Freq: Every day | ORAL | Status: DC
  Administered 2022-10-23: 2 via ORAL
  Filled 2022-10-22: qty 2

## 2022-10-22 MED ORDER — FLEET ENEMA 7-19 GM/118ML RE ENEM: 1.0000 | ENEMA | Freq: Every day | RECTAL | Status: DC | PRN

## 2022-10-22 MED ORDER — DIPHENHYDRAMINE HCL 25 MG PO CAPS: 25.0000 mg | ORAL_CAPSULE | Freq: Four times a day (QID) | ORAL | Status: DC | PRN

## 2022-10-22 MED ORDER — SIMETHICONE 80 MG PO CHEW: 80.0000 mg | CHEWABLE_TABLET | ORAL | Status: DC | PRN

## 2022-10-22 MED ORDER — ACETAMINOPHEN 325 MG PO TABS: 650.0000 mg | ORAL_TABLET | ORAL | Status: DC | PRN

## 2022-10-22 MED ORDER — MEASLES, MUMPS & RUBELLA VAC IJ SOLR: 0.5000 mL | Freq: Once | INTRAMUSCULAR | Status: DC

## 2022-10-22 MED ORDER — WITCH HAZEL-GLYCERIN EX PADS: 1.0000 | MEDICATED_PAD | CUTANEOUS | Status: DC | PRN

## 2022-10-22 MED ORDER — TETANUS-DIPHTH-ACELL PERTUSSIS 5-2.5-18.5 LF-MCG/0.5 IM SUSY: 0.5000 mL | PREFILLED_SYRINGE | Freq: Once | INTRAMUSCULAR | Status: DC

## 2022-10-22 MED ORDER — FENTANYL CITRATE (PF) 100 MCG/2ML IJ SOLN
50.0000 ug | INTRAMUSCULAR | Status: DC | PRN
  Administered 2022-10-22: 100 ug via INTRAVENOUS
  Filled 2022-10-22: qty 2

## 2022-10-22 MED ORDER — BENZOCAINE-MENTHOL 20-0.5 % EX AERO: 1.0000 | INHALATION_SPRAY | CUTANEOUS | Status: DC | PRN

## 2022-10-22 MED ORDER — OXYTOCIN BOLUS FROM INFUSION
333.0000 mL | Freq: Once | INTRAVENOUS | Status: AC
  Administered 2022-10-22: 333 mL via INTRAVENOUS

## 2022-10-22 MED ORDER — ARIPIPRAZOLE 5 MG PO TABS
5.0000 mg | ORAL_TABLET | Freq: Every day | ORAL | Status: DC
  Administered 2022-10-23: 5 mg via ORAL
  Filled 2022-10-22 (×2): qty 1

## 2022-10-22 MED ORDER — LIDOCAINE HCL (PF) 1 % IJ SOLN: 30.0000 mL | INTRAMUSCULAR | Status: DC | PRN

## 2022-10-22 MED ORDER — MEDROXYPROGESTERONE ACETATE 150 MG/ML IM SUSP: 150.0000 mg | INTRAMUSCULAR | Status: DC | PRN

## 2022-10-22 MED ORDER — LACTATED RINGERS IV SOLN: INTRAVENOUS | Status: DC

## 2022-10-22 MED ORDER — PRENATAL MULTIVITAMIN CH
1.0000 | ORAL_TABLET | Freq: Every day | ORAL | Status: DC
  Administered 2022-10-23: 1 via ORAL
  Filled 2022-10-22: qty 1

## 2022-10-22 MED ORDER — SOD CITRATE-CITRIC ACID 500-334 MG/5ML PO SOLN: 30.0000 mL | ORAL | Status: DC | PRN

## 2022-10-22 MED ORDER — COCONUT OIL OIL: 1.0000 | TOPICAL_OIL | Status: DC | PRN

## 2022-10-22 MED ORDER — IBUPROFEN 600 MG PO TABS
600.0000 mg | ORAL_TABLET | Freq: Four times a day (QID) | ORAL | Status: DC
  Administered 2022-10-22 – 2022-10-23 (×4): 600 mg via ORAL
  Filled 2022-10-22 (×4): qty 1

## 2022-10-22 MED ORDER — OXYTOCIN-SODIUM CHLORIDE 30-0.9 UT/500ML-% IV SOLN
2.5000 [IU]/h | INTRAVENOUS | Status: DC
  Filled 2022-10-22: qty 500

## 2022-10-22 MED ORDER — ONDANSETRON HCL 4 MG PO TABS: 4.0000 mg | ORAL_TABLET | ORAL | Status: DC | PRN

## 2022-10-22 MED ORDER — ONDANSETRON HCL 4 MG/2ML IJ SOLN
4.0000 mg | Freq: Four times a day (QID) | INTRAMUSCULAR | Status: DC | PRN
  Administered 2022-10-22: 4 mg via INTRAVENOUS
  Filled 2022-10-22: qty 2

## 2022-10-22 MED ORDER — DIBUCAINE (PERIANAL) 1 % EX OINT: 1.0000 | TOPICAL_OINTMENT | CUTANEOUS | Status: DC | PRN

## 2022-10-22 NOTE — H&P (Signed)
Brooke Castaneda is a 32 y.o. female presenting for labor and srom clear fluid.  Pregnancy uncomplicated and GBS neg. OB History     Gravida  3   Para  2   Term  2   Preterm  0   AB  0   Living  2      SAB  0   IAB  0   Ectopic  0   Multiple  0   Live Births  2          Past Medical History:  Diagnosis Date   ADD (attention deficit disorder)    ADHD (attention deficit hyperactivity disorder)    Anxiety    sees Dr. Toy Care    Asthma    Depression    sees Dr. Toy Care    Hx of varicella    Migraines    sees Dr. Conrad Biggers   OCD (obsessive compulsive disorder)    Panic disorder    Vaginal Pap smear, abnormal    Past Surgical History:  Procedure Laterality Date   BLADDER SURGERY  2012   COLONOSCOPY     Family History: family history includes Diabetes in her maternal grandmother; Thyroid disease in her mother. Social History:  reports that she has been smoking cigarettes. She has been smoking an average of .25 packs per day. She has never used smokeless tobacco. She reports that she does not currently use alcohol. She reports that she does not use drugs.     Maternal Diabetes: No Genetic Screening: Normal Maternal Ultrasounds/Referrals: Normal Fetal Ultrasounds or other Referrals:  None Maternal Substance Abuse:  No Significant Maternal Medications:  anxiety and ADD meds Significant Maternal Lab Results:  Group B Strep negative Number of Prenatal Visits:greater than 3 verified prenatal visits Other Comments:  None  Review of Systems History Dilation: 7 Effacement (%): 90 Station: -2 Exam by:: Cloretta Ned, RN Blood pressure 124/69, pulse 91, temperature 98.3 F (36.8 C), temperature source Oral, resp. rate 16, height 5\' 8"  (1.727 m), weight 96.8 kg, SpO2 99 %, unknown if currently breastfeeding. Exam Physical Exam  Vitals and nursing note reviewed. Exam conducted with a chaperone present.  Constitutional:      Appearance: Normal appearance.   HENT:     Head: Normocephalic.  Eyes:     Pupils: Pupils are equal, round, and reactive to light.  Cardiovascular:     Rate and Rhythm: Normal rate and regular rhythm.     Pulses: Normal pulses.  Abdominal:     General: Abdomen is Gravid, nontender Neurological:     Mental Status: She is alert. Prenatal labs: ABO, Rh: --/--/O POS (12/19 1302) Antibody: NEG (12/19 1302) Rubella: Immune (06/16 0000) RPR: Nonreactive (06/16 0000)  HBsAg: Negative (06/16 0000)  HIV: Non-reactive (06/16 0000)  GBS:     Assessment/Plan: IUP at term Active labor and SROM Anticipate SVD   [name] 10/22/2022, 11:56 AM

## 2022-10-22 NOTE — Lactation Note (Signed)
This note was copied from a baby's chart. Lactation Consultation Note  Patient Name: Girl Eusebia Grulke NTIRW'E Date: 10/22/2022   Age:32 hours Mom's feeding choice is formula. Maternal Data    Feeding Nipple Type: Slow - flow  LATCH Score                    Lactation Tools Discussed/Used    Interventions    Discharge    Consult Status Consult Status: Complete    Lillieanna Tuohy G 10/22/2022, 7:24 PM

## 2022-10-22 NOTE — MAU Note (Signed)
Brooke Castaneda is a 32 y.o. at [redacted]w[redacted]d here in MAU reporting: worsening contractions since previous MAU visit. For the past 2 hours they have been more consistent. They are every 2-3 min. No bleeding. States mucus plug has been coming out. Seeing some LOF that started this AM, fluid is clear. +FM.  Onset of complaint: ongoing but worse  Pain score: 8/10  Vitals:   10/22/22 1010  BP: 112/82  Pulse: 88  Resp: 16  Temp: 97.9 F (36.6 C)  SpO2: 99%     FHT:143  Lab orders placed from triage: none

## 2022-10-23 LAB — CBC
HCT: 30.8 % — ABNORMAL LOW (ref 36.0–46.0)
Hemoglobin: 10 g/dL — ABNORMAL LOW (ref 12.0–15.0)
MCH: 28.1 pg (ref 26.0–34.0)
MCHC: 32.5 g/dL (ref 30.0–36.0)
MCV: 86.5 fL (ref 80.0–100.0)
Platelets: 364 10*3/uL (ref 150–400)
RBC: 3.56 MIL/uL — ABNORMAL LOW (ref 3.87–5.11)
RDW: 13.3 % (ref 11.5–15.5)
WBC: 11.2 10*3/uL — ABNORMAL HIGH (ref 4.0–10.5)
nRBC: 0 % (ref 0.0–0.2)

## 2022-10-23 MED ORDER — IBUPROFEN 600 MG PO TABS: 600.0000 mg | ORAL_TABLET | Freq: Four times a day (QID) | ORAL | 0 refills | Status: AC | PRN

## 2022-10-23 NOTE — Discharge Summary (Signed)
Postpartum Discharge Summary  Date of Service updated 10/23/22     Patient Name: Brooke Castaneda DOB: 05-07-1991 MRN: [id number]  Date of admission: 10/22/2022 Delivery date:10/22/2022  Delivering provider: Louretta Shorten  Date of discharge: 10/23/2022  Admitting diagnosis: Normal labor [O80, Z37.9] Intrauterine pregnancy: [redacted]w[redacted]d     Secondary diagnosis:  Principal Problem:   Normal labor  Additional problems:     Discharge diagnosis: Term Pregnancy Delivered                                              Post partum procedures: Augmentation:  Complications: None  Hospital course: Onset of Labor With Vaginal Delivery      32 y.o. yo G3P3003 at [redacted]w[redacted]d was admitted in Active Labor on 10/22/2022. Labor course was complicated by  Membrane Rupture Time/Date: 12:32 PM ,10/22/2022   Delivery Method:Vaginal, Spontaneous  Episiotomy: None  Lacerations:  None  Patient had a postpartum course complicated by .  She is ambulating, tolerating a regular diet, passing flatus, and urinating well. Patient is discharged home in stable condition on 10/23/22.  Newborn Data: Birth date:10/22/2022  Birth time:12:57 PM  Gender:Female  Living status:Living  Apgars:9 ,9  Weight:3300 g   Magnesium Sulfate received: No BMZ received: No Rhophylac:No MMR:No T-DaP:Given prenatally Flu: No Transfusion:No  Physical exam  Vitals:   10/22/22 1551 10/22/22 1945 10/22/22 2318 10/23/22 0332  BP: 118/67 126/68 119/75 114/67  Pulse: 62 65 67 62  Resp: 18 18 17 17   Temp: 98.3 F (36.8 C) 98.2 F (36.8 C) 98.2 F (36.8 C) 98 F (36.7 C)  TempSrc: Oral Oral Oral Oral  SpO2: 98% 99% 99% 100%  Weight:      Height:       General: alert, cooperative, and no distress Lochia: appropriate Uterine Fundus: firm Incision: Healing well with no significant drainage DVT Evaluation: No evidence of DVT seen on physical exam. Labs: Lab Results  Component Value Date   WBC 11.2 (H) 10/23/2022   HGB 10.0 (L)  10/23/2022   HCT 30.8 (L) 10/23/2022   MCV 86.5 10/23/2022   PLT 364 10/23/2022      Latest Ref Rng & Units 10/02/2022    5:20 AM  CMP  Glucose 70 - 99 mg/dL 121   BUN 6 - 20 mg/dL 8   Creatinine 0.44 - 1.00 mg/dL 0.66   Sodium 135 - 145 mmol/L 136   Potassium 3.5 - 5.1 mmol/L 3.6   Chloride 98 - 111 mmol/L 103   CO2 22 - 32 mmol/L 20   Calcium 8.9 - 10.3 mg/dL 8.9    Edinburgh Score:    09/02/2017    5:36 AM  Edinburgh Postnatal Depression Scale Screening Tool  I have been able to laugh and see the funny side of things. 0  I have looked forward with enjoyment to things. 0  I have blamed myself unnecessarily when things went wrong. 0  I have been anxious or worried for no good reason. 1  I have felt scared or panicky for no good reason. 1  Things have been getting on top of me. 0  I have been so unhappy that I have had difficulty sleeping. 0  I have felt sad or miserable. 0  I have been so unhappy that I have been crying. 0  The thought of harming myself has  occurred to me. 0  Edinburgh Postnatal Depression Scale Total 2      After visit meds:  Allergies as of 10/23/2022       Reactions   Flagyl [metronidazole] Diarrhea   Toradol [ketorolac Tromethamine] Other (See Comments)   Reaction:  Tachycardia   Tramadol Other (See Comments)   Reaction:  Tachycardia        Medication List     STOP taking these medications    amphetamine-dextroamphetamine 20 MG tablet Commonly known as: ADDERALL   ARIPiprazole 5 MG tablet Commonly known as: ABILIFY   nitrofurantoin 100 MG capsule Commonly known as: Macrodantin   omeprazole 20 MG capsule Commonly known as: PRILOSEC   ondansetron 4 MG tablet Commonly known as: Zofran   oxyCODONE 5 MG immediate release tablet Commonly known as: Oxy IR/ROXICODONE       TAKE these medications    acetaminophen 325 MG tablet Commonly known as: TYLENOL Take 2 tablets (650 mg total) by mouth every 4 (four) hours as needed  (for pain scale < 4  OR  temperature  >/=  100.5 F).   ibuprofen 600 MG tablet Commonly known as: ADVIL Take 1 tablet (600 mg total) by mouth every 6 (six) hours as needed. What changed:  medication strength how much to take reasons to take this         Discharge home in stable condition Infant Feeding: Breast Infant Disposition:home with mother Discharge instruction: per After Visit Summary and Postpartum booklet. Activity: Advance as tolerated. Pelvic rest for 6 weeks.  Diet: routine diet Anticipated Birth Control: Unsure Postpartum Appointment:6 weeks Additional Postpartum F/U:  Future Appointments: Future Appointments  Date Time Provider Department Center  11/13/2022 10:00 AM Birder Robson, MD [facility] None   Follow up Visit:      10/23/2022 Leslie Andrea, MD

## 2022-10-23 NOTE — Social Work (Signed)
MOB was referred for history of depression/anxiety.  * Referral screened out by Clinical Social Worker because none of the following criteria appear to apply:  ~ History of anxiety/depression during this pregnancy, or of post-partum depression following prior delivery.  ~ Diagnosis of anxiety and/or depression within last 3 years MOB diagnosed prior to January 2021. OR * MOB's symptoms currently being treated with medication and/or therapy. MOB has active prescription for Abilify.  Please contact the Clinical Social Worker if needs arise, or by MOB request.  Letta Kocher, [place] Social Worker [phone]

## 2022-10-30 ENCOUNTER — Telehealth (HOSPITAL_COMMUNITY): Payer: Self-pay | Admitting: *Deleted

## 2022-10-30 NOTE — Telephone Encounter (Signed)
Left phone voicemail message.  Odis Hollingshead, RN 10-30-2022 at 10:02am

## 2022-11-08 ENCOUNTER — Inpatient Hospital Stay (HOSPITAL_COMMUNITY): Admit: 2022-11-08 | Payer: Self-pay

## 2022-11-13 ENCOUNTER — Ambulatory Visit: Payer: 59 | Admitting: Internal Medicine

## 2022-12-29 ENCOUNTER — Emergency Department (HOSPITAL_BASED_OUTPATIENT_CLINIC_OR_DEPARTMENT_OTHER): Payer: 59

## 2022-12-29 ENCOUNTER — Encounter (HOSPITAL_BASED_OUTPATIENT_CLINIC_OR_DEPARTMENT_OTHER): Payer: Self-pay | Admitting: Emergency Medicine

## 2022-12-29 ENCOUNTER — Emergency Department (HOSPITAL_BASED_OUTPATIENT_CLINIC_OR_DEPARTMENT_OTHER)
Admission: EM | Admit: 2022-12-29 | Discharge: 2022-12-29 | Disposition: A | Discharge status: Home / Self Care | Payer: 59 | Attending: Emergency Medicine | Admitting: Emergency Medicine

## 2022-12-29 ENCOUNTER — Other Ambulatory Visit: Payer: Self-pay

## 2022-12-29 DIAGNOSIS — N201 Calculus of ureter: Secondary | ICD-10-CM

## 2022-12-29 DIAGNOSIS — D72829 Elevated white blood cell count, unspecified: Secondary | ICD-10-CM | POA: Diagnosis not present

## 2022-12-29 DIAGNOSIS — R739 Hyperglycemia, unspecified: Secondary | ICD-10-CM | POA: Diagnosis not present

## 2022-12-29 DIAGNOSIS — R000 Tachycardia, unspecified: Secondary | ICD-10-CM | POA: Insufficient documentation

## 2022-12-29 DIAGNOSIS — R7401 Elevation of levels of liver transaminase levels: Secondary | ICD-10-CM | POA: Diagnosis not present

## 2022-12-29 DIAGNOSIS — K603 Anal fistula: Secondary | ICD-10-CM | POA: Diagnosis not present

## 2022-12-29 DIAGNOSIS — N132 Hydronephrosis with renal and ureteral calculous obstruction: Secondary | ICD-10-CM | POA: Diagnosis not present

## 2022-12-29 DIAGNOSIS — L0231 Cutaneous abscess of buttock: Secondary | ICD-10-CM | POA: Insufficient documentation

## 2022-12-29 LAB — CBC WITH DIFFERENTIAL/PLATELET
Abs Immature Granulocytes: 0.03 10*3/uL (ref 0.00–0.07)
Basophils Absolute: 0 10*3/uL (ref 0.0–0.1)
Basophils Relative: 0 %
Eosinophils Absolute: 0 10*3/uL (ref 0.0–0.5)
Eosinophils Relative: 0 %
HCT: 40.2 % (ref 36.0–46.0)
Hemoglobin: 13 g/dL (ref 12.0–15.0)
Immature Granulocytes: 0 %
Lymphocytes Relative: 7 %
Lymphs Abs: 1 10*3/uL (ref 0.7–4.0)
MCH: 27.4 pg (ref 26.0–34.0)
MCHC: 32.3 g/dL (ref 30.0–36.0)
MCV: 84.6 fL (ref 80.0–100.0)
Monocytes Absolute: 0.7 10*3/uL (ref 0.1–1.0)
Monocytes Relative: 5 %
Neutro Abs: 12.8 10*3/uL — ABNORMAL HIGH (ref 1.7–7.7)
Neutrophils Relative %: 88 %
Platelets: 277 10*3/uL (ref 150–400)
RBC: 4.75 MIL/uL (ref 3.87–5.11)
RDW: 16 % — ABNORMAL HIGH (ref 11.5–15.5)
WBC: 14.6 10*3/uL — ABNORMAL HIGH (ref 4.0–10.5)
nRBC: 0 % (ref 0.0–0.2)

## 2022-12-29 LAB — COMPREHENSIVE METABOLIC PANEL
ALT: 11 U/L (ref 0–44)
AST: 11 U/L — ABNORMAL LOW (ref 15–41)
Albumin: 4.1 g/dL (ref 3.5–5.0)
Alkaline Phosphatase: 73 U/L (ref 38–126)
Anion gap: 7 (ref 5–15)
BUN: 8 mg/dL (ref 6–20)
CO2: 27 mmol/L (ref 22–32)
Calcium: 9.2 mg/dL (ref 8.9–10.3)
Chloride: 101 mmol/L (ref 98–111)
Creatinine, Ser: 0.66 mg/dL (ref 0.44–1.00)
GFR, Estimated: >60 mL/min (ref >60)
Glucose, Bld: 101 mg/dL — ABNORMAL HIGH (ref 70–99)
Potassium: 3.7 mmol/L (ref 3.5–5.1)
Sodium: 135 mmol/L (ref 135–145)
Total Bilirubin: 0.7 mg/dL (ref 0.3–1.2)
Total Protein: 7.3 g/dL (ref 6.5–8.1)

## 2022-12-29 LAB — LACTIC ACID, PLASMA: Lactic Acid, Venous: 0.7 mmol/L (ref 0.5–1.9)

## 2022-12-29 LAB — HCG, QUANTITATIVE, PREGNANCY: hCG, Beta Chain, Quant, S: <1 m[IU]/mL (ref <5)

## 2022-12-29 MED ORDER — LACTATED RINGERS IV BOLUS
1000.0000 mL | Freq: Once | INTRAVENOUS | Status: AC
  Administered 2022-12-29: 1000 mL via INTRAVENOUS

## 2022-12-29 MED ORDER — SULFAMETHOXAZOLE-TRIMETHOPRIM 800-160 MG PO TABS: 1.0000 | ORAL_TABLET | Freq: Two times a day (BID) | ORAL | 0 refills | Status: AC

## 2022-12-29 MED ORDER — AMOXICILLIN-POT CLAVULANATE 875-125 MG PO TABS: 1.0000 | ORAL_TABLET | Freq: Two times a day (BID) | ORAL | 0 refills | Status: AC

## 2022-12-29 MED ORDER — IOHEXOL 300 MG/ML  SOLN
100.0000 mL | Freq: Once | INTRAMUSCULAR | Status: AC | PRN
  Administered 2022-12-29: 80 mL via INTRAVENOUS

## 2022-12-29 MED ORDER — OXYCODONE HCL 5 MG PO TABS
5.0000 mg | ORAL_TABLET | Freq: Once | ORAL | Status: AC
  Administered 2022-12-29: 5 mg via ORAL
  Filled 2022-12-29: qty 1

## 2022-12-29 NOTE — Discharge Instructions (Addendum)
You were seen in the ER today for evaluation of your rectal abscess.  This drained while you were here.  I will place you on 2 antibiotics that she will take twice a day for the next 7 days.  Please make sure you are cleaning the wound daily and after using the bathroom with Dial soap and water.  Can also try a peribottle as well.  You can try Tylenol or ibuprofen as needed for pain.  Please make sure you call urology about the kidney stone seen.  Additionally, please call [facility] first thing Monday morning to see if they have any availability so they can try to work you in.  Can also try sitz bath's as well.  If you have any concerns, new or worsening symptoms, please return to the nearest emergency room for evaluation.  Contact a health care provider if: You have pain that does not get better with medicine. You have new redness or swelling near the anus. You have new fluid, blood, or pus coming from the anus. The area around the anus feels tender or warm. You have a fever or chills. You have diarrhea. Get help right away if: You are in severe pain. You have severe problems peeing (urinating) or pooping.

## 2022-12-29 NOTE — ED Provider Notes (Signed)
[facility] Provider Note   CSN: [id number] Arrival date & time: 12/29/22  1039     History {Add pertinent medical, surgical, social history, OB history to HPI:1} Chief Complaint  Patient presents with   Rectal Pain    Brooke Castaneda. is a 32 y.o. female.  HPI     Home Medications Prior to Admission medications   Medication Sig Start Date End Date Taking? Authorizing Provider  acetaminophen (TYLENOL) 325 MG tablet Take 2 tablets (650 mg total) by mouth every 4 (four) hours as needed (for pain scale < 4  OR  temperature  >/=  100.5 F). 10/03/22   Charlotta Newton, MD  ibuprofen (ADVIL) 600 MG tablet Take 1 tablet (600 mg total) by mouth every 6 (six) hours as needed. 10/23/22   Everlene Farrier, MD      Allergies    Flagyl [metronidazole], Toradol [ketorolac tromethamine], and Tramadol    Review of Systems   Review of Systems  Physical Exam Updated Vital Signs BP 115/75   Pulse (!) 107   Temp 98.6 F (37 C) (Oral)   Resp 18   SpO2 98%  Physical Exam  ED Results / Procedures / Treatments   Labs (all labs ordered are listed, but only abnormal results are displayed) Labs Reviewed  CBC WITH DIFFERENTIAL/PLATELET - Abnormal; Notable for the following components:      Result Value   WBC 14.6 (*)    RDW 16.0 (*)    Neutro Abs 12.8 (*)    All other components within normal limits  COMPREHENSIVE METABOLIC PANEL - Abnormal; Notable for the following components:   Glucose, Bld 101 (*)    AST 11 (*)    All other components within normal limits  HCG, QUANTITATIVE, PREGNANCY  LACTIC ACID, PLASMA    EKG None  Radiology CT ABDOMEN PELVIS W CONTRAST  Result Date: 12/29/2022 CLINICAL DATA:  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 Large abscess seen to the right buttock region near the anise. EXAM: CT ABDOMEN AND PELVIS WITH CONTRAST TECHNIQUE: Multidetector CT imaging of the abdomen and pelvis was performed using the standard  protocol following bolus administration of intravenous contrast. RADIATION DOSE REDUCTION: This exam was performed according to the departmental dose-optimization program which includes automated exposure control, adjustment of the mA and/or kV according to patient size and/or use of iterative reconstruction technique. CONTRAST:  88mL OMNIPAQUE IOHEXOL 300 MG/ML  SOLN COMPARISON:  11/25/2012 FINDINGS: Lower chest: Unremarkable Hepatobiliary: No suspicious focal abnormality within the liver parenchyma. There is no evidence for gallstones, gallbladder wall thickening, or pericholecystic fluid. No intrahepatic or extrahepatic biliary dilation. Pancreas: No focal mass lesion. No dilatation of the main duct. No intraparenchymal cyst. No peripancreatic edema. Spleen: No splenomegaly. No focal mass lesion. Adrenals/Urinary Tract: No adrenal nodule or mass. Right kidney and ureter unremarkable. 3 x 5 x 7 mm stone identified proximal left ureter (axial 43/2 and coronal 46/5) without substantial left-sided hydro ureteral nephrosis. No other urinary stone disease evident. The urinary bladder appears normal for the degree of distention. Stomach/Bowel: Stomach is unremarkable. No gastric wall thickening. No evidence of outlet obstruction. Duodenum is normally positioned as is the ligament of Treitz. No small bowel wall thickening. No small bowel dilatation. The terminal ileum is normal. The appendix is normal. No gross colonic mass. No colonic wall thickening. 3.7 x 2.6 x 5.0 cm rim enhancing collection of gas, debris, and fluid is identified in the low left buttock region, compatible  with abscess. There is surrounding edema/inflammation. Linear band of enhancement tracks from the cranial margin of this abscess up towards the left side of the anus (see coronal 34/5) suggesting the presence of a fistula. Vascular/Lymphatic: No abdominal aortic aneurysm. No abdominal lymphadenopathy. No pelvic sidewall lymphadenopathy.  Reproductive: Unremarkable. Other: No intraperitoneal free fluid. Musculoskeletal: No worrisome lytic or sclerotic osseous abnormality. IMPRESSION: 1. 3.7 x 2.6 x 5.0 cm rim enhancing collection of gas, debris, and fluid in the low left buttock region, compatible with abscess. There is surrounding edema/inflammation. Linear band of enhancement tracks from the cranial margin of this abscess up towards the left side of the anus suggesting the presence of a fistula. 2. 3 x 5 x 7 mm stone identified proximal left ureter without substantial left-sided hydro ureteral nephrosis. No other urinary stone disease evident. Electronically Signed   By: Misty Stanley M.D.   On: 12/29/2022 16:04    Procedures Procedures  {Document cardiac monitor, telemetry assessment procedure when appropriate:1}  Medications Ordered in ED Medications  oxyCODONE (Oxy IR/ROXICODONE) immediate release tablet 5 mg (5 mg Oral Given 12/29/22 1243)  lactated ringers bolus 1,000 mL (0 mLs Intravenous Stopped 12/29/22 1641)  iohexol (OMNIPAQUE) 300 MG/ML solution 100 mL (80 mLs Intravenous Contrast Given 12/29/22 1512)    ED Course/ Medical Decision Making/ A&P   {   Click here for ABCD2, HEART and other calculatorsREFRESH Note before signing :1}                          Medical Decision Making Amount and/or Complexity of Data Reviewed Labs: ordered. Radiology: ordered.  Risk Prescription drug management.   ***  {Document critical care time when appropriate:1} {Document review of labs and clinical decision tools ie heart score, Chads2Vasc2 etc:1}  {Document your independent review of radiology images, and any outside records:1} {Document your discussion with family members, caretakers, and with consultants:1} {Document social determinants of health affecting pt's care:1} {Document your decision making why or why not admission, treatments were needed:1} Final Clinical Impression(s) / ED Diagnoses Final diagnoses:  None     Rx / DC Orders ED Discharge Orders     None

## 2022-12-29 NOTE — ED Triage Notes (Signed)
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 Pt reports this has been a problem for "years."  Pt reports giving birth 2 months ago that made it worse.

## 2024-08-30 ENCOUNTER — Encounter (HOSPITAL_COMMUNITY): Payer: Self-pay | Admitting: *Deleted

## 2024-08-30 ENCOUNTER — Other Ambulatory Visit: Payer: Self-pay

## 2024-08-30 ENCOUNTER — Emergency Department (HOSPITAL_COMMUNITY)
Admission: EM | Admit: 2024-08-30 | Discharge: 2024-08-30 | Discharge status: Against Medical Advice | Attending: Emergency Medicine | Admitting: Emergency Medicine

## 2024-08-30 DIAGNOSIS — Z5321 Procedure and treatment not carried out due to patient leaving prior to being seen by health care provider: Secondary | ICD-10-CM | POA: Diagnosis not present

## 2024-08-30 DIAGNOSIS — K602 Anal fissure, unspecified: Secondary | ICD-10-CM | POA: Insufficient documentation

## 2024-08-30 LAB — CBC
HCT: 39.5 % (ref 36.0–46.0)
Hemoglobin: 13 g/dL (ref 12.0–15.0)
MCH: 30.1 pg (ref 26.0–34.0)
MCHC: 32.9 g/dL (ref 30.0–36.0)
MCV: 91.4 fL (ref 80.0–100.0)
Platelets: 247 K/uL (ref 150–400)
RBC: 4.32 MIL/uL (ref 3.87–5.11)
RDW: 13.1 % (ref 11.5–15.5)
WBC: 10.2 K/uL (ref 4.0–10.5)
nRBC: 0 % (ref 0.0–0.2)

## 2024-08-30 LAB — COMPREHENSIVE METABOLIC PANEL WITH GFR
ALT: 14 U/L (ref 0–44)
AST: 20 U/L (ref 15–41)
Albumin: 3.5 g/dL (ref 3.5–5.0)
Alkaline Phosphatase: 70 U/L (ref 38–126)
Anion gap: 11 (ref 5–15)
BUN: 9 mg/dL (ref 6–20)
CO2: 23 mmol/L (ref 22–32)
Calcium: 8.6 mg/dL — ABNORMAL LOW (ref 8.9–10.3)
Chloride: 105 mmol/L (ref 98–111)
Creatinine, Ser: 0.8 mg/dL (ref 0.44–1.00)
GFR, Estimated: >60 mL/min (ref >60)
Glucose, Bld: 101 mg/dL — ABNORMAL HIGH (ref 70–99)
Potassium: 3.8 mmol/L (ref 3.5–5.1)
Sodium: 139 mmol/L (ref 135–145)
Total Bilirubin: 0.9 mg/dL (ref 0.0–1.2)
Total Protein: 6.8 g/dL (ref 6.5–8.1)

## 2024-08-30 LAB — URINALYSIS, MICROSCOPIC (REFLEX)

## 2024-08-30 LAB — URINALYSIS, ROUTINE W REFLEX MICROSCOPIC
Bilirubin Urine: NEGATIVE
Glucose, UA: NEGATIVE mg/dL
Hgb urine dipstick: NEGATIVE
Ketones, ur: NEGATIVE mg/dL
Nitrite: NEGATIVE
Protein, ur: NEGATIVE mg/dL
Specific Gravity, Urine: >1.03 — ABNORMAL HIGH (ref 1.005–1.030)
pH: 6 (ref 5.0–8.0)

## 2024-08-30 LAB — HCG, SERUM, QUALITATIVE: Preg, Serum: NEGATIVE

## 2024-08-30 LAB — LIPASE, BLOOD: Lipase: 25 U/L (ref 11–51)

## 2024-08-30 MED ORDER — OXYCODONE-ACETAMINOPHEN 5-325 MG PO TABS
ORAL_TABLET | ORAL | Status: AC
  Filled 2024-08-30: qty 1

## 2024-08-30 MED ORDER — OXYCODONE-ACETAMINOPHEN 5-325 MG PO TABS
1.0000 | ORAL_TABLET | Freq: Once | ORAL | Status: AC
  Administered 2024-08-30: 1 via ORAL

## 2024-08-30 NOTE — ED Triage Notes (Signed)
 The pt has an anal fissure  for years with episodes of swelling and  having to have it drained  she has much pain now   lmp 2 days ago

## 2024-08-30 NOTE — ED Notes (Signed)
 Patient left.

## 2024-08-30 NOTE — ED Triage Notes (Addendum)
 Pt states she has anal fissure, c/o pain and swelling.

## 2024-08-31 ENCOUNTER — Emergency Department (HOSPITAL_COMMUNITY)
Admission: EM | Admit: 2024-08-31 | Discharge: 2024-08-31 | Discharge status: Against Medical Advice | Attending: Emergency Medicine | Admitting: Emergency Medicine

## 2024-08-31 ENCOUNTER — Other Ambulatory Visit: Payer: Self-pay

## 2024-08-31 ENCOUNTER — Telehealth: Admitting: Emergency Medicine

## 2024-08-31 ENCOUNTER — Encounter (HOSPITAL_COMMUNITY): Payer: Self-pay

## 2024-08-31 DIAGNOSIS — K6289 Other specified diseases of anus and rectum: Secondary | ICD-10-CM | POA: Diagnosis present

## 2024-08-31 DIAGNOSIS — Z5321 Procedure and treatment not carried out due to patient leaving prior to being seen by health care provider: Secondary | ICD-10-CM | POA: Insufficient documentation

## 2024-08-31 DIAGNOSIS — R52 Pain, unspecified: Secondary | ICD-10-CM

## 2024-08-31 MED ORDER — OXYCODONE-ACETAMINOPHEN 5-325 MG PO TABS
1.0000 | ORAL_TABLET | Freq: Once | ORAL | Status: AC
  Administered 2024-08-31: 1 via ORAL
  Filled 2024-08-31: qty 1

## 2024-08-31 NOTE — ED Triage Notes (Signed)
 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 Pt reports she had blood work and urine done here last night and does not want the results discussed around visitors.

## 2024-08-31 NOTE — Progress Notes (Signed)
 I am very limited in what I can treat by evisit.   It sounds like you are describing an anal abscess more than a fistula. I do recommend you go back to the ER for care. I am sorry I cannot help you by evisit.   Based on what you shared with me, I feel your condition warrants further evaluation as soon as possible at an Emergency department.    NOTE: There will be NO CHARGE for this eVisit   If you are having a true medical emergency please call 911.      Emergency Department-[facility]  Get Driving Directions  [phone]  [address]  [place], [place] [address]  Open 24/7/365      [facility] Emergency Department at [facility]  Get Driving Directions  [address]  [place], [place] [address]  Open 24/7/365    Emergency Department- [facility]  Get Driving Directions  [phone]  [address]  [place], [place] [address]  Open 24/7/365      [facility]  Get Driving Directions  [phone]  [address]  [place], [place] [address]  Open 24/7/365    [facility]  Emergency Department- [facility]  Get Driving Directions  [phone]  [address]  [place], [place] [address]  Open 24/7/365    [place]  Emergency Department- [facility]  Get Driving Directions  [address]  [place], [place] [address]  Open 24/7/365    [facility]  Emergency Department- [facility]  Get Driving Directions  [phone]  [address]  [place], [place] [address]  Open 24/7/365

## 2024-08-31 NOTE — ED Provider Triage Note (Signed)
 Emergency Medicine Provider Triage Evaluation Note  Brooke Castaneda [place] , a 33 y.o. female  was evaluated in triage.  Pt complains of rectal pain.  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  Came yesterday but LWBS due to wait..  Review of Systems  Positive: Rectal pain Negative: fever  Physical Exam  BP 115/65 (BP Location: Right Arm)   Pulse 100   Temp 98.3 F (36.8 C) (Oral)   Resp 18   LMP 08/27/2024   SpO2 100%  Gen:   Awake, no distress   Resp:  Normal effort  MSK:   Moves extremities without difficulty  Other:  Sitting on side, rectal exam deferred in triage  Medical Decision Making  Medically screening exam initiated at 4:24 AM.  Appropriate orders placed.  Brooke Castaneda V. was informed that the remainder of the evaluation will be completed by another provider, this initial triage assessment does not replace that evaluation, and the importance of remaining in the ED until their evaluation is complete.  Labs done yesterday which were reassuring.  UA did show trich +.  Rectal deferred in triage.     Jarold Olam HERO, PA-C 08/31/24 0425

## 2024-08-31 NOTE — ED Notes (Signed)
 Pt not in lobby called name x3

## 2024-10-12 ENCOUNTER — Ambulatory Visit: Payer: Self-pay | Admitting: General Surgery

## 2024-10-12 NOTE — H&P (Signed)
 " REFERRING PHYSICIAN:  Ann Darice HERO, NP  PROVIDER:  BERNARDA WANDA NED, MD  MRN: [id number] DOB: 05/29/1991 DATE OF ENCOUNTER: 10/12/2024  Subjective   Chief Complaint: New Consultation     History of Present Illness: Brooke Castaneda is a 33 y.o. female who is seen today as an office consultation at the request of Dr. Ann for evaluation of New Consultation .  Patient states that she has had recurrent abscesses for approximately 5 years.  She has noticed them on both sides of her anal canal.  Most recently she has had trouble with the left side.  She continues to have drainage and pain on a daily basis.   Review of Systems: A complete review of systems was obtained from the patient.  I have reviewed this information and discussed as appropriate with the patient.  See HPI as well for other ROS.     Medical History: Past Medical History:  Diagnosis Date   Asthma, unspecified asthma severity, unspecified whether complicated, unspecified whether persistent ([facility]HS-HCC)     There is no problem list on file for this patient.   History reviewed. No pertinent surgical history.   Allergies  Allergen Reactions   Ketorolac  Other (See Comments) and Palpitations    Reaction:  Tachycardia  ketorolac  tromethamine    Tramadol  Other (See Comments)    Reaction:  Tachycardia   Sertraline Other (See Comments)    Zoloft   Metronidazole  Diarrhea    Current Outpatient Medications on File Prior to Visit  Medication Sig Dispense Refill   ALPRAZolam  (XANAX ) 1 MG tablet TAKE 1 AND 1/2 TABLETS BY MOUTH EVERY DAY AS NEEDED     ARIPiprazole  (ABILIFY ) 5 MG tablet Take 5 mg by mouth at bedtime     No current facility-administered medications on file prior to visit.    Family History  Problem Relation Age of Onset   Stroke Mother    Diabetes Mother      Social History   Tobacco Use  Smoking Status Every Day   Current packs/day: 0.50   Types: Cigarettes  Smokeless Tobacco  Never     Social History   Socioeconomic History   Marital status: Widowed  Tobacco Use   Smoking status: Every Day    Current packs/day: 0.50    Types: Cigarettes   Smokeless tobacco: Never  Substance and Sexual Activity   Alcohol use: Never   Drug use: Never   Social Drivers of Corporate Investment Banker Strain: Low Risk (01/01/2023)   Received from [facility]   Overall Financial Resource Strain (CARDIA)    Difficulty of Paying Living Expenses: Not hard at all  Food Insecurity: No Food Insecurity (01/01/2023)   Received from [facility]   Hunger Vital Sign    Within the past 12 months, you worried that your food would run out before you got the money to buy more.: Never true    Within the past 12 months, the food you bought just didn't last and you didn't have money to get more.: Never true  Transportation Needs: No Transportation Needs (01/01/2023)   Received from [facility] - Transportation    Lack of Transportation (Medical): No    Lack of Transportation (Non-Medical): No  Physical Activity: Unknown (01/01/2023)   Received from [facility]   Exercise Vital Sign    On average, how many days per week do you engage in moderate to strenuous exercise (like a brisk walk)?: 7 days  On average, how many minutes do you engage in exercise at this level?: Patient declined  Stress: Stress Concern Present (01/01/2023)   Received from [facility] - Occupational Stress Questionnaire    Feeling of Stress : To some extent  Social Connections: Socially Integrated (01/01/2023)   Received from [facility]   Social Network    How would you rate your social network (family, work, friends)?: Good participation with social networks  Housing Stability: Low Risk (01/01/2023)   Received from [facility] Stability Vital Sign    Unable to Pay for Housing in the Last Year: No    Number of Places Lived in the Last Year:  1    In the last 12 months, was there a time when you did not have a steady place to sleep or slept in a shelter (including now)?: No    Objective:    Vitals:   10/12/24 1551 10/12/24 1552  BP: 102/64   Pulse: (!) 112   Temp: 36.8 C (98.3 F)   SpO2: 98%   Weight: 67.9 kg (149 lb 9.6 oz)   Height: 172.7 cm (5' 8)   PainSc:  0-No pain     Exam Gen: NAD Abd: Soft Rectal: Palpable cord and left anterior external opening draining purulence   Labs, Imaging and Diagnostic Testing: Previous notes from GYN reviewed.  Previous ED reports reviewed as well.  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  I do not palpate any other areas of scar or possible fistula occurring on the right side.  I have recommended exam under anesthesia with fistulotomy versus seton placement of the left anterior lesion.  We will also evaluate for any other fistula tracts during the exam.    Bernarda JAYSON Ned, MD Colon and Rectal Surgery [facility]     "
